# Patient Record
Sex: Male | Born: 1985 | Race: Black or African American | Hispanic: No | Smoking: Current every day smoker
Health system: Southern US, Community
[De-identification: ages and names within clinical notes are randomized; demographics above are authoritative.]

---
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
## 2005-04-11 ENCOUNTER — Emergency Department (HOSPITAL_COMMUNITY): Admission: EM | Admit: 2005-04-11 | Discharge: 2005-04-11 | Payer: Self-pay | Admitting: Emergency Medicine

## 2005-10-15 ENCOUNTER — Emergency Department (HOSPITAL_COMMUNITY): Admission: EM | Admit: 2005-10-15 | Discharge: 2005-10-15 | Payer: Self-pay | Admitting: Emergency Medicine

## 2006-05-17 ENCOUNTER — Emergency Department (HOSPITAL_COMMUNITY): Admission: EM | Admit: 2006-05-17 | Discharge: 2006-05-17 | Payer: Self-pay | Admitting: Emergency Medicine

## 2007-12-17 ENCOUNTER — Emergency Department (HOSPITAL_COMMUNITY): Admission: EM | Admit: 2007-12-17 | Discharge: 2007-12-17 | Payer: Self-pay | Admitting: Emergency Medicine
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
## 2008-09-19 ENCOUNTER — Emergency Department (HOSPITAL_COMMUNITY): Admission: EM | Admit: 2008-09-19 | Discharge: 2008-09-19 | Payer: Self-pay | Admitting: Emergency Medicine

## 2010-04-23 LAB — GC/CHLAMYDIA PROBE AMP, GENITAL
Chlamydia, DNA Probe: NEGATIVE
GC Probe Amp, Genital: NEGATIVE

## 2010-07-31 ENCOUNTER — Emergency Department (HOSPITAL_COMMUNITY)
Admission: EM | Admit: 2010-07-31 | Discharge: 2010-07-31 | Disposition: A | Discharge status: Home / Self Care | Payer: Medicaid Other | Attending: Emergency Medicine | Admitting: Emergency Medicine

## 2010-07-31 DIAGNOSIS — Y92410 Unspecified street and highway as the place of occurrence of the external cause: Secondary | ICD-10-CM | POA: Insufficient documentation

## 2010-07-31 DIAGNOSIS — S335XXA Sprain of ligaments of lumbar spine, initial encounter: Secondary | ICD-10-CM | POA: Insufficient documentation

## 2010-07-31 DIAGNOSIS — R51 Headache: Secondary | ICD-10-CM | POA: Insufficient documentation

## 2010-07-31 DIAGNOSIS — M79609 Pain in unspecified limb: Secondary | ICD-10-CM | POA: Insufficient documentation

## 2010-07-31 DIAGNOSIS — IMO0002 Reserved for concepts with insufficient information to code with codable children: Secondary | ICD-10-CM | POA: Insufficient documentation

## 2010-07-31 DIAGNOSIS — M545 Low back pain, unspecified: Secondary | ICD-10-CM | POA: Insufficient documentation

## 2010-08-02 ENCOUNTER — Emergency Department (HOSPITAL_COMMUNITY)
Admission: EM | Admit: 2010-08-02 | Discharge: 2010-08-02 | Disposition: A | Discharge status: Home / Self Care | Payer: No Typology Code available for payment source | Attending: Emergency Medicine | Admitting: Emergency Medicine

## 2010-08-02 ENCOUNTER — Emergency Department (HOSPITAL_COMMUNITY): Payer: No Typology Code available for payment source

## 2010-08-02 DIAGNOSIS — M549 Dorsalgia, unspecified: Secondary | ICD-10-CM | POA: Insufficient documentation

## 2010-08-02 DIAGNOSIS — S20229A Contusion of unspecified back wall of thorax, initial encounter: Secondary | ICD-10-CM | POA: Insufficient documentation

## 2010-08-02 DIAGNOSIS — IMO0002 Reserved for concepts with insufficient information to code with codable children: Secondary | ICD-10-CM | POA: Insufficient documentation

## 2011-10-04 ENCOUNTER — Emergency Department (HOSPITAL_COMMUNITY): Payer: Medicaid Other

## 2011-10-04 ENCOUNTER — Encounter (HOSPITAL_COMMUNITY): Payer: Self-pay | Admitting: Emergency Medicine

## 2011-10-04 ENCOUNTER — Emergency Department (HOSPITAL_COMMUNITY)
Admission: EM | Admit: 2011-10-04 | Discharge: 2011-10-04 | Disposition: A | Discharge status: Home / Self Care | Payer: Medicaid Other | Attending: Emergency Medicine | Admitting: Emergency Medicine

## 2011-10-04 DIAGNOSIS — T148XXA Other injury of unspecified body region, initial encounter: Secondary | ICD-10-CM | POA: Insufficient documentation

## 2011-10-04 DIAGNOSIS — X58XXXA Exposure to other specified factors, initial encounter: Secondary | ICD-10-CM | POA: Insufficient documentation

## 2011-10-04 LAB — D-DIMER, QUANTITATIVE: D-Dimer, Quant: <0.27 ug/mL-FEU (ref 0.00–0.48)

## 2011-10-04 MED ORDER — OXYCODONE-ACETAMINOPHEN 5-325 MG PO TABS: 1.0000 | ORAL_TABLET | ORAL | Status: DC | PRN

## 2011-10-04 MED ORDER — OXYCODONE-ACETAMINOPHEN 5-325 MG PO TABS
1.0000 | ORAL_TABLET | Freq: Once | ORAL | Status: AC
  Administered 2011-10-04: 1 via ORAL
  Filled 2011-10-04: qty 1

## 2011-10-04 NOTE — Progress Notes (Signed)
Orthopedic Tech Progress Note Patient Details:  Daryl Avery 07-16-1985 [id number]  Ortho Devices Type of Ortho Device: Crutches Ortho Device/Splint Interventions: Ordered;Application   Jennye Moccasin 10/04/2011, 2:11 PM

## 2011-10-04 NOTE — ED Notes (Signed)
Pt here for difficulty walking this am due to pain in left foot.

## 2011-10-04 NOTE — ED Notes (Signed)
Pt c/o left leg pain starting yesterday; pt denies obvious injury

## 2011-10-04 NOTE — ED Provider Notes (Signed)
History  This chart was scribed for Joya Gaskins, MD by Ladona Ridgel Day. This patient was seen in room TR07C/TR07C and the patient's care was started at 1004.   CSN: [id number]  Arrival date & time 10/04/11  1004   First MD Initiated Contact with Patient 10/04/11 1103      Chief Complaint  Patient presents with  . Leg Pain   Patient is a 26 y.o. male presenting with leg pain. The history is provided by the patient. No language interpreter was used.  Leg Pain  The incident occurred 3 to 5 hours ago. There was no injury mechanism. The pain is present in the left thigh. The pain is mild. The pain has been constant since onset. Numbness: None now but earlier this AM had numbness. He reports no foreign bodies present. The symptoms are aggravated by bearing weight. He has tried nothing for the symptoms.   Daryl Avery is a 26 y.o. male who presents to the Emergency Department complaining of constant non radiating left thigh pain since this AM with no injury to his leg/thigh. He denies any falls, trips, or twisting his leg. He states that he does some lifting at his job. He denies any recent travels or surgeries. He denies any similar previous episodes. He denies CP or SOB. He has no medical history.  PMH -none  History reviewed. No pertinent past surgical history.  History reviewed. No pertinent family history.  History  Substance Use Topics  . Smoking status: Never Smoker   . Smokeless tobacco: Not on file  . Alcohol Use: No      Review of Systems  Constitutional: Negative for fever and activity change.  Respiratory: Negative for shortness of breath.   Cardiovascular: Negative for chest pain.  Gastrointestinal: Negative for abdominal distention.  Musculoskeletal: Negative for back pain.       Left thigh pain  Neurological: Numbness: None now but earlier this AM had numbness.    Allergies  Review of patient's allergies indicates no known allergies.  Home Medications  No  current outpatient prescriptions on file.  Triage Vitals: BP 122/70  Pulse 69  Temp 98.4 F (36.9 C) (Oral)  Resp 18  SpO2 99%  Physical Exam CONSTITUTIONAL: Well developed/well nourished HEAD AND FACE: Normocephalic/atraumatic EYES: EOMI/PERRL ENMT: Mucous membranes moist NECK: supple no meningeal signs SPINE:entire spine nontender CV: S1/S2 noted, no murmurs/rubs/gallops noted LUNGS: Lungs are clear to auscultation bilaterally, no apparent distress ABDOMEN: soft, nontender, no rebound or guarding,  GU:no cva tenderness, no testicular tenderness, no hernia (family present at his request) NEURO: Pt is awake/alert, moves all extremitiesx4 EXTREMITIES: pulses normal, full ROM, tenderness along medial aspect of left thigh, no erythema or edema, full ROM of hip and knee.  No calf tenderness.  No bony tenderness to left knee/ankle/foot.  Minimal tenderness with rom of left hip (he will allow me to fully range hip) no warmth noted to left hip SKIN: warm, color normal PSYCH: no abnormalities of mood noted  ED Course  Procedures DIAGNOSTIC STUDIES: Oxygen Saturation is 99% on room air, normal by my interpretation.    COORDINATION OF CARE: At 1135 AM Discussed treatment plan with patient which includes left thigh X-ray. Patient agrees.   Low risk for DVT And ddimer neg Xray negative He has tenderness mostly along medial aspect of thigh.  He reports he may have no injured at work.  Doubt occult fx or septic joint.  Advised crutches and outpatient followup  MDM  Nursing notes including past medical history and social history reviewed and considered in documentation xrays reviewed and considered Labs/vital reviewed and considered    I personally performed the services described in this documentation, which was scribed in my presence. The recorded information has been reviewed and considered.          Joya Gaskins, MD 10/04/11 [phone]

## 2013-07-30 ENCOUNTER — Encounter (HOSPITAL_COMMUNITY): Payer: Self-pay | Admitting: Emergency Medicine

## 2013-07-30 ENCOUNTER — Emergency Department (HOSPITAL_COMMUNITY)
Admission: EM | Admit: 2013-07-30 | Discharge: 2013-07-30 | Disposition: A | Discharge status: Home / Self Care | Payer: Medicaid Other | Attending: Emergency Medicine | Admitting: Emergency Medicine

## 2013-07-30 DIAGNOSIS — L03119 Cellulitis of unspecified part of limb: Principal | ICD-10-CM

## 2013-07-30 DIAGNOSIS — L0291 Cutaneous abscess, unspecified: Secondary | ICD-10-CM

## 2013-07-30 DIAGNOSIS — L02419 Cutaneous abscess of limb, unspecified: Secondary | ICD-10-CM | POA: Insufficient documentation

## 2013-07-30 MED ORDER — CEPHALEXIN 500 MG PO CAPS: ORAL_CAPSULE | ORAL | Status: DC

## 2013-07-30 MED ORDER — OXYCODONE-ACETAMINOPHEN 5-325 MG PO TABS: 1.0000 | ORAL_TABLET | Freq: Four times a day (QID) | ORAL | Status: DC | PRN

## 2013-07-30 MED ORDER — SULFAMETHOXAZOLE-TMP DS 800-160 MG PO TABS: 1.0000 | ORAL_TABLET | Freq: Two times a day (BID) | ORAL | Status: DC

## 2013-07-30 NOTE — ED Provider Notes (Signed)
CSN: [id number]     Arrival date & time 07/30/13  1707 History   None    This chart was scribed for non-physician practitioner, Allen Derry PA-C  working with Lyanne Co, MD by Arlan Organ, ED Scribe. This patient was seen in room TR10C/TR10C and the patient's care was started at 6:36 PM.   Chief Complaint  Patient presents with  . Abscess   Patient is a 28 y.o. male presenting with abscess. The history is provided by the patient. No language interpreter was used.  Abscess Location:  Leg Leg abscess location:  L knee Size:  6 cm Abscess quality: fluctuance, induration and painful   Abscess quality: not draining   Duration:  3 days Progression:  Worsening Pain details:    Duration:  3 days   Timing:  Constant   Progression:  Unchanged Chronicity:  New Context: not insect bite/sting   Relieved by:  None tried Worsened by:  Nothing tried Ineffective treatments:  None tried Associated symptoms: no fever     HPI Comments: Daryl Avery is a 28 y.o. male who presents to the Emergency Department complaining of a constant, moderately painful abscess to the posterior L knee x 3 days that has progressively worsened, non-radiating pain. Denies red streaking or drainage. Discomfort is exacerbated with flexion/extension of the L knee, palpation, and ambulation. No alleviating factors at this time. He has not tried anything OTC or any home remedies for improvement of symptoms. At this time he denies any fever, chills, SOB, chest pain, N/V, or abdominal pain. No swelling to lower extremities, no recent traveling. Pt is unaware of any recent insect bites or trauma to this area. He denies any known allergies to medications. Pt is otherwise healthy without any medical problems, denies hx of DM. No other concerns this visit.  History reviewed. No pertinent past medical history. History reviewed. No pertinent past surgical history. No family history on file. History  Substance Use  Topics  . Smoking status: Never Smoker   . Smokeless tobacco: Not on file  . Alcohol Use: No    Review of Systems  Constitutional: Negative for fever and chills.  Respiratory: Negative for shortness of breath.   Cardiovascular: Negative for chest pain.  Skin: Positive for wound (Abscess to posterior L knee).  Psychiatric/Behavioral: Negative for confusion.      Allergies  Review of patient's allergies indicates no known allergies.  Home Medications   Prior to Admission medications   Medication Sig Start Date End Date Taking? Authorizing Provider  cephALEXin (KEFLEX) 500 MG capsule 2 caps po bid x 7 days 07/30/13   Jimmy Plessinger Strupp Camprubi-Soms, PA-C  oxyCODONE-acetaminophen (PERCOCET) 5-325 MG per tablet Take 1-2 tablets by mouth every 6 (six) hours as needed for severe pain. 07/30/13   Benancio Osmundson Strupp Camprubi-Soms, PA-C  sulfamethoxazole-trimethoprim (BACTRIM DS) 800-160 MG per tablet Take 1 tablet by mouth 2 (two) times daily. 07/30/13   Joelys Staubs Strupp Camprubi-Soms, PA-C   Triage Vitals: BP 128/84  Pulse 83  Temp(Src) 98 F (36.7 C) (Oral)  Resp 16  Wt 153 lb 4.8 oz (69.536 kg)  SpO2 97%   Physical Exam  Nursing note and vitals reviewed. Constitutional: He is oriented to person, place, and time. Vital signs are normal. He appears well-developed and well-nourished.  Non-toxic appearance. No distress.  Afebrile, nontoxic, NAD  HENT:  Head: Normocephalic.  Mouth/Throat: Mucous membranes are normal.  Eyes: Conjunctivae and EOM are normal.  Neck: Normal range of motion.  Cardiovascular:  Normal rate, intact distal pulses and normal pulses.   Intact distal pulses  Pulmonary/Chest: Effort normal.  Abdominal: Normal appearance. He exhibits no distension.  Musculoskeletal:       Right knee: Normal. He exhibits normal range of motion, no swelling, no effusion, no erythema, normal alignment, no LCL laxity, no bony tenderness, normal meniscus and no MCL laxity. No tenderness  found.       Left knee: He exhibits decreased range of motion (secondary to pain) and swelling (induration posteriorly). He exhibits no effusion, no laceration, no erythema, normal alignment, no LCL laxity, no bony tenderness, normal meniscus and no MCL laxity. No tenderness found.       Legs: L posterior knee with area of induration approx 8cm in diameter, with small 3cm area of fluctuance in center, TTP with no warmth, erythema or red streaking, ROM limited due to pain but passive ROM fully intact. No bony or joint line TTP, no laxity or joint effusion. Neg homan's. No pedal edema  Neurological: He is alert and oriented to person, place, and time. He has normal strength. No sensory deficit.  Strength 5/5 in all extremities, neurovascularly intact in BLEs.  Skin: Skin is warm and dry.     8cm area of induration located in posterior L knee, small 3cm area of fluctuance noted, mildly TTP, no warmth or surrounding cellulitis.   Psychiatric: He has a normal mood and affect.    ED Course  INCISION AND DRAINAGE Date/Time: 07/31/2013 6:45 PM Performed by: CAMPRUBI-SOMS, Tyrese Capriotti STRUPP Authorized by: Ramond MarrowAMPRUBI-SOMS, Baudelia Schroepfer STRUPP Consent: Verbal consent obtained. Risks and benefits: risks, benefits and alternatives were discussed Consent given by: patient Patient understanding: patient states understanding of the procedure being performed Patient consent: the patient's understanding of the procedure matches consent given Patient identity confirmed: verbally with patient Type: abscess Body area: lower extremity Location details: left leg Anesthesia: local infiltration Local anesthetic: lidocaine 2% without epinephrine Anesthetic total: 1 ml Patient sedated: no Scalpel size: 11 Needle gauge: 22 Incision type: single straight Complexity: simple Drainage: purulent and  bloody Drainage amount: moderate Packing material: 1/4 in iodoform gauze Patient tolerance: Patient tolerated the  procedure well with no immediate complications. Comments: Site anesthetized, incision made over site, wound drained and explored loculations, rinsed with copious amounts of normal saline, wound packed with sterile gauze, covered with dry, sterile dressing.  Pt tolerated procedure well without complications.  Instructions for care discussed verbally and pt provided with additional written instructions for homecare and f/u.     (including critical care time)  DIAGNOSTIC STUDIES: Oxygen Saturation is 97% on RA, Adequate by my interpretation.    COORDINATION OF CARE: 6:44 PM- Will perform I&D and give 2 antibiotics to help manage symptoms. Advised pt to follow up at Urgent Care. Encouraged pt to watch for signs of infection outlined on discharge papers. Discussed treatment plan with pt at bedside and pt agreed to plan.     Labs Review Labs Reviewed  WOUND CULTURE    Imaging Review No results found.   EKG Interpretation None      MDM   Final diagnoses:  Abscess    Daryl Avery is a 28 y.o. male with no significant PMHx presenting with 3 days of painful abscess to L posterior knee. Afebrile, non-toxic. Small area of fluctuance located in center of induration. No surrounding cellulitis, no s/sx of DVT, no LE edema. No hx of MRSA or DM2. I&D with 1/4" packing tolerated well by pt. Purulent material was  cultured. Given abx and percocet, instructed to f/up with Urgent care in 2 days for packing removal and evaluation. Discussed s/sx of infection to monitor for, as well as d/c instructions on care of abscess and packing. No need for imaging at this time given that this was a simple uncomplicated abscess which was superficial and did not have s/sx of deep space infection. I explained the diagnosis and have given explicit precautions to return to the ER including for any other new or worsening symptoms. The patient understands and accepts the medical plan as it's been dictated and I have  answered their questions. Discharge instructions concerning home care and prescriptions have been given. The patient is STABLE and is discharged to home in good condition.   I personally performed the services described in this documentation, which was scribed in my presence. The recorded information has been reviewed and is accurate.  BP 128/84  Pulse 83  Temp(Src) 98 F (36.7 C) (Oral)  Resp 16  Wt 153 lb 4.8 oz (69.536 kg)  SpO2 97%     [employer], PA-C 07/31/13 [phone]

## 2013-07-30 NOTE — Discharge Instructions (Signed)
Keep wound clean and dry. Apply warm compresses to affected area throughout the day. Take antibiotic until it is finished. Take percocet as directed, as needed for pain but do not drive or operate machinery with pain medication use. [facility] 2 days for wound recheck and packing removal.  Return to emergency department for emergent changing or worsening symptoms.   Abscess An abscess (boil or furuncle) is an infected area on or under the skin. This area is filled with yellowish-white fluid (pus) and other material (debris). HOME CARE   Only take medicines as told by your doctor.  If you were given antibiotic medicine, take it as directed. Finish the medicine even if you start to feel better.  If gauze is used, follow your doctor's directions for changing the gauze.  To avoid spreading the infection:  Keep your abscess covered with a bandage.  Wash your hands well.  Do not share personal care items, towels, or whirlpools with others.  Avoid skin contact with others.  Keep your skin and clothes clean around the abscess.  Keep all doctor visits as told. GET HELP RIGHT AWAY IF:   You have more pain, puffiness (swelling), or redness in the wound site.  You have more fluid or blood coming from the wound site.  You have muscle aches, chills, or you feel sick.  You have a fever. MAKE SURE YOU:   Understand these instructions.  Will watch your condition.  Will get help right away if you are not doing well or get worse. Document Released: 06/22/2007 Document Revised: 07/05/2011 Document Reviewed: 03/18/2011 [facility] Patient Information 2015 [place]. This information is not intended to replace advice given to you by your health care provider. Make sure you discuss any questions you have with your health care provider.  Abscess Care After An abscess (also called a boil or furuncle) is an infected area that contains a  collection of pus. Signs and symptoms of an abscess include pain, tenderness, redness, or hardness, or you may feel a moveable soft area under your skin. An abscess can occur anywhere in the body. The infection may spread to surrounding tissues causing cellulitis. A cut (incision) by the surgeon was made over your abscess and the pus was drained out. Gauze may have been packed into the space to provide a drain that will allow the cavity to heal from the inside outwards. The boil may be painful for 5 to 7 days. Most people with a boil do not have high fevers. Your abscess, if seen early, may not have localized, and may not have been lanced. If not, another appointment may be required for this if it does not get better on its own or with medications. HOME CARE INSTRUCTIONS   Only take over-the-counter or prescription medicines for pain, discomfort, or fever as directed by your caregiver.  When you bathe, soak and then remove gauze or iodoform packs at least daily or as directed by your caregiver. You may then wash the wound gently with mild soapy water. Repack with gauze or do as your caregiver directs. SEEK IMMEDIATE MEDICAL CARE IF:   You develop increased pain, swelling, redness, drainage, or bleeding in the wound site.  You develop signs of generalized infection including muscle aches, chills, fever, or a general ill feeling.  An oral temperature above 102 F (38.9 C) develops, not controlled by medication. See your caregiver for a recheck if you develop any of the symptoms described  above. If medications (antibiotics) were prescribed, take them as directed. Document Released: 07/22/2004 Document Revised: 03/28/2011 Document Reviewed: 03/19/2007 [facility] Patient Information 2015 [place], [place]. This information is not intended to replace advice given to you by your health care provider. Make sure you discuss any questions you have with your health care provider.  Emergency Department Resource  Guide 1) Find a Doctor and Pay Out of Pocket Although you won't have to find out who is covered by your insurance plan, it is a good idea to ask around and get recommendations. You will then need to call the office and see if the doctor you have chosen will accept you as a new patient and what types of options they offer for patients who are self-pay. Some doctors offer discounts or will set up payment plans for their patients who do not have insurance, but you will need to ask so you aren't surprised when you get to your appointment.  2) Contact Your Local Health Department Not all health departments have doctors that can see patients for sick visits, but many do, so it is worth a call to see if yours does. If you don't know where your local health department is, you can check in your phone book. The CDC also has a tool to help you locate your state's health department, and many state websites also have listings of all of their local health departments.  3) Find a Walk-in Clinic If your illness is not likely to be very severe or complicated, you may want to try a walk in clinic. These are popping up all over the country in pharmacies, drugstores, and shopping centers. They're usually staffed by nurse practitioners or physician assistants that have been trained to treat common illnesses and complaints. They're usually fairly quick and inexpensive. However, if you have serious medical issues or chronic medical problems, these are probably not your best option.  No Primary Care Doctor: - Call Health Connect at  [phone] - they can help you locate a primary care doctor that  accepts your insurance, provides certain services, etc. - Physician Referral Service- [phone]  Chronic Pain Problems: Organization         Address  Phone   Notes  [facility]  [phone] Patients need to be referred by their primary care doctor.   Medication Assistance: Organization          Address  Phone   Notes  [facility] Medication Physicians [facility] [address] [place], [place] [address] [phone] --Must be a resident of [facility] -- Must have NO insurance coverage whatsoever (no Medicaid/ Medicare, etc.) -- The pt. MUST have a primary care doctor that directs their care regularly and follows them in the community   [facility]  [phone]   [employer]  [phone]    Agencies that provide inexpensive medical care: Organization         Address  Phone   Notes  [facility]  [phone]   [facility]    [phone]   [facility] [address] [place], [place] [address] [phone]   [facility] [address] [place] [place] [phone]   Planned Parenthood    [phone]   [facility]    [phone]   [facility]  [address],  Phone:  [phone], Fax:  [phone] Hours  of Operation:  9 am - 6 pm, M-F.  Also accepts Medicaid/Medicare and self-pay.  [facility] [place] [facility]  [address], [place] Phone: [phone], Fax: [phone]. Hours of Operation:  8:30 am - 5:30 pm, M-F.  Also accepts Medicaid and self-pay.  [facility] [place] [address] [place] Phone: [phone]   [facility] [address] Natasha Bence [place], [place] [phone], Ext. [phone] Mondays & Thursdays: 7-9 AM.  First 15 patients are seen on a first come, first serve basis.    Medicaid-accepting [facility] Providers:  Organization         Address  Phone   Notes  [facility] [address], [place] [phone] Also accepts self-pay patients.  [facility] [address]ure[address] Josephs [place], [place]  [phone]   [facility] [address], [place] [phone]   [facility] [address] [place] [phone]   Renaye Rakers [address], [place]   [phone] Only accepts [place] Access [place] patients after they have their name applied to their card.   Self-Pay (no insurance) in [facility]:  Organization         Address  Phone   Notes  Sickle Cell Patients, [facility] Internal Medicine [address] [place], [place] [phone]   [facility] [address] [place], [place] [phone]   [facility] [place]  [address], [address],  [phone]   [facility]/Dr. Osei-Bonsu  [address] [place] or [address], [place] [phone] Phone number for both [place] and [place] locations is the same.  Urgent Medical and [facility] [address], [place] [phone]   [facility] [address] [place] or [address] [phone]   [facility] [address] [place] [phone], phone; [phone], fax Sees patients 1st and 3rd Saturday of every month.  Must not qualify for public or private insurance (i.e. Medicaid, Medicare, [place] Health Choice, Veterans' Benefits)  Household income should be no more than 200% of the poverty level The clinic cannot treat you if you are pregnant or think you are pregnant  Sexually transmitted diseases are not treated at the clinic.    Dental Care: Organization         Address  Phone  Notes  [facility] Department of [facility] [address] [place], [place] [phone] Accepts children up to age 62 who are enrolled in [place] or [place] Health Choice; pregnant women with a Medicaid card; and children who have applied for Medicaid or [place] Health Choice, but were declined, whose parents can pay a reduced fee at time of service.  [facility] Department of [facility]  [address], [place] [phone] Accepts children up to age 30 who are enrolled in [place] or [place] Health Choice; pregnant women with a Medicaid card; and children who have applied for Medicaid or  Health Choice, but were declined, whose parents can pay a reduced fee at time of service.  [facility]  [address] [place], [place] [phone] Patients are seen by appointment only. Walk-ins are not accepted. [facility] will see  patients 63 years of age and older. Monday - Tuesday (8am-5pm) Most Wednesdays (8:30-5pm) $30 per visit, cash only  [facility] Access PRO[facility]  [address], [facility] [place] [phone] Patients are seen by appointment only. Walk-ins are not accepted. [facility] will see patients 44 years of age and older. One Wednesday Evening (Monthly: Volunteer Based).  $30 per visit, cash only  [employer]  [phone] for adults; Children under age 42, call Graduate Pediatric Dentistry at [phone]. Children aged 62-14, please call [phone] to request a pediatric application.  Dental services are provided in all areas of dental care including fillings, crowns and bridges, complete and partial dentures, implants, gum treatment, root canals, and extractions. Preventive care is also provided. Treatment is provided to both adults and children. Patients are selected via a lottery and there is often a waiting list.   [facility] [address] [place]  [phone] [email or web address]   [facility] [address] [place], [place] [phone], Ext. [phone] Second and Fourth Thursday of each month, opens at 6:30 AM; Clinic ends at 9 AM.  Patients are seen on a first-come first-served basis, and a limited number are seen during each clinic.   [facility]  [address] Ether Griffins [place], [place] [phone]   Eligibility Requirements You must  have lived in [place], [place], or [place] counties for at least the last three months.   You cannot be eligible for state or federal sponsored [place], including [employer], [place], or [employer].   You generally cannot be eligible for healthcare insurance through your employer.    How to apply: Eligibility screenings are held every Tuesday and Wednesday afternoon from 1:00 pm until 4:00 pm. You do not need an appointment for the interview!  [facility] [address] [place], [place] [phone]   [facility] Health Department  [phone]   [facility] Health Department  [phone]   [facility] Health Department  [phone]    Behavioral Health Resources in the Community: Intensive Outpatient Programs Organization         Address  Phone  Notes  [facility] [address] [place], [place] [phone]   [facility] Outpatient [address] [place], [place] [phone]   [facility] [address], [place], [place]  [phone]   [facility] Mental Health [address],  [place], [place] [phone] or [phone]   Substance Abuse Resources Organization         Address  Phone  Notes  [facility] Drug Services  [phone]   [facility]  [phone]   [facility]  [phone]   Floydene Flock  [phone]   Residential & Outpatient Substance Abuse Program  [phone]   Psychological Services Organization         Address  Phone  Notes  [facility] Behavioral Health  [phone]   [facility]  [phone]   [facility] Mental Health [address] [place] [phone] or [phone]    Mobile Crisis Teams Organization         Address  Phone  Notes  [facility], Mobile Crisis Care Unit  [phone]   [facility]  [address] [place], [place] [phone]   [facility] [address] [place] [phone]    Self-Help/Support Groups Organization  Address  Phone             Notes  [facility] [place] - variety of support groups  336- I7437963 Call for more information  Narcotics Anonymous (NA), [facility] [address], [employer] [place]  2 meetings at this location   Statistician         Address  Phone  Notes  [facility] [address],    [place]  [phone]   [facility]  [address] [place] [address], [place], [place] [phone]   [facility] [address] [place], [place] [phone] Admissions: 8am-3pm M-F  [facility] [address]    [place], [place] [phone]   [facility] [address] [place], [place], [place] [phone]   [facility] [address]  [place], [place] [phone]   [facility] - Intensive Outpatient [address], [place], [place] [phone]   [facility] (Addiction Recovery [facility] [address]  [place], [place] [phone] or 7043828632   [facility] ([facility]) [address] [place], [place] [phone] Accepts Medicaid  [facility] [address]  [place] [phone] Substance Abuse/Addiction Treatment   [facility] Organization         Address  Phone  Notes  [facility]  [phone]   Angie Fava, PhD [address] Ervin Knack [place], [place]   [phone] or [phone]   [facility]   [address] [place], [place] [phone]   [facility] [address] [place], [place] [phone] Insurance/Medicaid/sponsorship through [facility] and Families [address]                                    [place], [place] [phone] Therapy/tele-psych/case  [facility] [address], [place] [phone]    Dr. Lolly Mustache  [phone]   [facility]  [employer] [facility] Dept. 1) [address] [place] 2) [address], [place] 3)  [address], [place] [phone]  [phone]   [facility] Child Abuse Hotline (435)593-8630 or [phone] (After Hours)

## 2013-07-30 NOTE — ED Notes (Signed)
Abscess behind lt. Knee.  Red and swollen with a white center, no drainage.  Painful to touch and when pt. Bends his leg.

## 2013-08-01 ENCOUNTER — Emergency Department (HOSPITAL_COMMUNITY)
Admission: EM | Admit: 2013-08-01 | Discharge: 2013-08-01 | Disposition: A | Discharge status: Home / Self Care | Payer: Medicaid Other | Source: Home / Self Care

## 2013-08-01 ENCOUNTER — Encounter (HOSPITAL_COMMUNITY): Payer: Self-pay | Admitting: Emergency Medicine

## 2013-08-01 ENCOUNTER — Emergency Department (HOSPITAL_COMMUNITY)
Admission: EM | Admit: 2013-08-01 | Discharge: 2013-08-01 | Disposition: A | Discharge status: Home / Self Care | Payer: Medicaid Other | Attending: Emergency Medicine | Admitting: Emergency Medicine

## 2013-08-01 DIAGNOSIS — Z09 Encounter for follow-up examination after completed treatment for conditions other than malignant neoplasm: Secondary | ICD-10-CM

## 2013-08-01 DIAGNOSIS — Z4801 Encounter for change or removal of surgical wound dressing: Secondary | ICD-10-CM | POA: Insufficient documentation

## 2013-08-01 DIAGNOSIS — Z792 Long term (current) use of antibiotics: Secondary | ICD-10-CM | POA: Diagnosis not present

## 2013-08-01 MED ORDER — OXYCODONE-ACETAMINOPHEN 5-325 MG PO TABS: 1.0000 | ORAL_TABLET | Freq: Four times a day (QID) | ORAL | Status: DC | PRN

## 2013-08-01 NOTE — ED Provider Notes (Signed)
Medical screening examination/treatment/procedure(s) were performed by non-physician practitioner and as supervising physician I was immediately available for consultation/collaboration.   EKG Interpretation None        Grantley Savage, MD 08/01/13 1341 

## 2013-08-01 NOTE — ED Notes (Signed)
Pt here for wound check from cyst that was drained two days ago per pt; pt sts taking antibiotics and sts some drainage

## 2013-08-01 NOTE — Discharge Instructions (Signed)
Continue to take your antibiotics until complete, even if your wound seems completely healed.  Be sure to follow up with urgent care or a primary care provider in 3-4 days for wound recheck. See below for further instructions.

## 2013-08-01 NOTE — ED Provider Notes (Signed)
CSN: [id number]     Arrival date & time 08/01/13  1008 History  This chart was scribed for non-physician practitioner Junius Finner, working with Glynn Octave, MD by Carl Best, ED Scribe. This patient was seen in room TR05C/TR05C and the patient's care was started at 10:48 AM.    Chief Complaint  Patient presents with  . Wound Check    Patient is a 28 y.o. male presenting with wound check. The history is provided by the patient. No language interpreter was used.  Wound Check   HPI Comments: Daryl Avery is a 28 y.o. male who presents to the Emergency Department for a wound check of an abscess located on his left leg that was drained two days ago.  He denies experiencing any pain.  The patient states that he is still taking the Bactrim and Keflex he was was discharged with at his last visit.  He states that there is still some drainage noted from the I&D site.  He denies fever, nausea, and vomiting as associated symptoms and states that he has more movement in his left leg.  He denies applying warm compresses to the area.  He has no prior history of abscess.  He denies having a PCP.   History reviewed. No pertinent past medical history. History reviewed. No pertinent past surgical history. History reviewed. No pertinent family history. History  Substance Use Topics  . Smoking status: Never Smoker   . Smokeless tobacco: Not on file  . Alcohol Use: No    Review of Systems  Constitutional: Negative for fever.  Gastrointestinal: Negative for nausea and vomiting.  Skin: Positive for wound.  All other systems reviewed and are negative.     Allergies  Review of patient's allergies indicates no known allergies.  Home Medications   Prior to Admission medications   Medication Sig Start Date End Date Taking? Authorizing Provider  cephALEXin (KEFLEX) 500 MG capsule 2 caps po bid x 7 days 07/30/13   Mercedes Strupp Camprubi-Soms, PA-C  oxyCODONE-acetaminophen (PERCOCET) 5-325  MG per tablet Take 1-2 tablets by mouth every 6 (six) hours as needed for severe pain. 07/30/13   Mercedes Strupp Camprubi-Soms, PA-C  sulfamethoxazole-trimethoprim (BACTRIM DS) 800-160 MG per tablet Take 1 tablet by mouth 2 (two) times daily. 07/30/13   Mercedes Strupp Camprubi-Soms, PA-C   Triage Vitals: BP 130/81  Pulse 71  Temp(Src) 98.1 F (36.7 C) (Oral)  Resp 18  SpO2 97%  Physical Exam  Nursing note and vitals reviewed. Constitutional: He is oriented to person, place, and time. He appears well-developed and well-nourished.  HENT:  Head: Normocephalic and atraumatic.  Eyes: EOM are normal.  Neck: Normal range of motion.  Cardiovascular: Normal rate.   Pulmonary/Chest: Effort normal.  Musculoskeletal: Normal range of motion.  Full range of motion of left knee.  Neurological: He is alert and oriented to person, place, and time.  Skin: Skin is warm and dry.  Left posterior leg just inferior to knee 2-3 cm area of induration and tenderness with centralized opening wound packing in place.  Scant red blood present.  No pus.  Mild tenderness to palpation.    Psychiatric: He has a normal mood and affect. His behavior is normal.    ED Course  Procedures  The wound is cleansed, debrided of foreign material as much as possible, and dressed. The patient is alerted to watch for any signs of infection (redness, pus, pain, increased swelling or fever) and call if such occurs. Home wound care  instructions are provided.   DIAGNOSTIC STUDIES: Oxygen Saturation is 97% on room air, adequate by my interpretation.    COORDINATION OF CARE: 10:50 AM- Advised the patient to massage the area to assist the wound in draining and to finish his course of antibiotics.  Discussed discharging the patient with a referral to an outpatient clinic for follow-up in the next 2-3 days.  The patient agreed to the treatment plan.   Labs Review Labs Reviewed - No data to display  Imaging Review No results  found.   EKG Interpretation None      MDM   Final diagnoses:  Encounter for recheck of abscess following incision and drainage    Pt is a 28yo male presenting to ED for recheck of abscess that was I&D on 7/14.  Abscess appears to be healing well.  Packing removed. Bandage placed. Home care instructions provided. Advised to f/u with PCP or urgent care for wound recheck in 3-4 days. Return precautions provided. Pt verbalized understanding and agreement with tx plan.   I personally performed the services described in this documentation, which was scribed in my presence. The recorded information has been reviewed and is accurate.   Junius Finnerrin O'Malley, PA-C 08/01/13 1113

## 2013-08-02 ENCOUNTER — Telehealth (HOSPITAL_BASED_OUTPATIENT_CLINIC_OR_DEPARTMENT_OTHER): Payer: Self-pay | Admitting: Emergency Medicine

## 2013-08-02 LAB — WOUND CULTURE: Special Requests: NORMAL

## 2013-08-02 NOTE — Telephone Encounter (Signed)
Post ED Visit - Positive Culture Follow-up   Positive wound culture +MRSA Treated with Bactrim DS and Keflex, organism sensitive to the same and no further patient follow-up is required at this time.  08/02/13 @ 1338, left message for patient to call flow managers #  Jiles HaroldGammons, Tienna Bienkowski Chaney 08/02/2013, 1:39 PM

## 2013-08-03 ENCOUNTER — Telehealth (HOSPITAL_COMMUNITY): Payer: Self-pay

## 2013-08-03 NOTE — ED Provider Notes (Signed)
Medical screening examination/treatment/procedure(s) were performed by non-physician practitioner and as supervising physician I was immediately available for consultation/collaboration.   EKG Interpretation None        Daryl CoKevin M Zelma Mazariego, MD 08/03/13 [phone]

## 2013-08-04 ENCOUNTER — Telehealth (HOSPITAL_BASED_OUTPATIENT_CLINIC_OR_DEPARTMENT_OTHER): Payer: Self-pay

## 2013-08-04 ENCOUNTER — Telehealth (HOSPITAL_BASED_OUTPATIENT_CLINIC_OR_DEPARTMENT_OTHER): Payer: Self-pay | Admitting: Emergency Medicine

## 2013-08-04 NOTE — Telephone Encounter (Addendum)
Late entry 08/04/2013 @ 2153 Pt returned call.  Pt informed of dx, tx prescribed appropriate and MRSA precautions reviewed.

## 2013-10-25 ENCOUNTER — Emergency Department (HOSPITAL_COMMUNITY)
Admission: EM | Admit: 2013-10-25 | Discharge: 2013-10-25 | Disposition: A | Discharge status: Home / Self Care | Payer: Medicaid Other | Attending: Emergency Medicine | Admitting: Emergency Medicine

## 2013-10-25 ENCOUNTER — Encounter (HOSPITAL_COMMUNITY): Payer: Self-pay | Admitting: Emergency Medicine

## 2013-10-25 DIAGNOSIS — L03313 Cellulitis of chest wall: Secondary | ICD-10-CM | POA: Insufficient documentation

## 2013-10-25 DIAGNOSIS — L0291 Cutaneous abscess, unspecified: Secondary | ICD-10-CM

## 2013-10-25 DIAGNOSIS — L0390 Cellulitis, unspecified: Secondary | ICD-10-CM

## 2013-10-25 MED ORDER — LIDOCAINE-EPINEPHRINE (PF) 2 %-1:200000 IJ SOLN
10.0000 mL | Freq: Once | INTRAMUSCULAR | Status: AC
  Administered 2013-10-25: 10 mL
  Filled 2013-10-25: qty 20

## 2013-10-25 MED ORDER — CEPHALEXIN 500 MG PO CAPS: 500.0000 mg | ORAL_CAPSULE | Freq: Four times a day (QID) | ORAL | Status: DC

## 2013-10-25 MED ORDER — SULFAMETHOXAZOLE-TRIMETHOPRIM 800-160 MG PO TABS: 1.0000 | ORAL_TABLET | Freq: Two times a day (BID) | ORAL | Status: DC

## 2013-10-25 NOTE — ED Notes (Signed)
Pt reports having abscess under right arm x 2 days. Denies fevers. No acute distress noted at triage.

## 2013-10-25 NOTE — ED Provider Notes (Signed)
Medical screening examination/treatment/procedure(s) were performed by non-physician practitioner and as supervising physician I was immediately available for consultation/collaboration.   EKG Interpretation None      Devoria AlbeIva Jered Heiny, MD, Armando GangFACEP   Ward GivensIva L Milt Coye, MD 10/25/13 [phone]

## 2013-10-25 NOTE — ED Provider Notes (Signed)
CSN: [id number]     Arrival date & time 10/25/13  1044 History  This chart was scribed for non-physician practitioner, Santiago GladHeather Magalie Almon, PA-C, working with Ward GivensIva L Knapp, MD by Charline BillsEssence Howell, ED Scribe. This patient was seen in room TR11C/TR11C and the patient's care was started at 11:54 AM.   Chief Complaint  Patient presents with  . Abscess   The history is provided by the patient. No language interpreter was used.   HPI Comments: Daryl Avery is a 28 y.o. male who presents to the Emergency Department complaining of gradually improving abscess to R lateral upper chest first noted 2 days ago. Pt reports h/o abscess to leg. He denies fever, chills, nausea, vomiting, drainage from the site. He also denies h/o DM or HIV. Pt has not tried to drain the abscess.  History reviewed. No pertinent past medical history. History reviewed. No pertinent past surgical history. History reviewed. No pertinent family history. History  Substance Use Topics  . Smoking status: Never Smoker   . Smokeless tobacco: Not on file  . Alcohol Use: No    Review of Systems  Constitutional: Negative for fever and chills.  Gastrointestinal: Negative for nausea and vomiting.  Skin:       + Abscess  All other systems reviewed and are negative.  Allergies  Review of patient's allergies indicates no known allergies.  Home Medications   Prior to Admission medications   Not on File   Triage Vitals: BP 115/81  Pulse 60  Temp(Src) 97.9 F (36.6 C) (Oral)  Resp 14  Ht 5' (1.524 m)  SpO2 98% Physical Exam  Nursing note and vitals reviewed. Constitutional: He is oriented to person, place, and time. He appears well-developed and well-nourished. No distress.  HENT:  Head: Normocephalic and atraumatic.  Eyes: Conjunctivae and EOM are normal.  Neck: Neck supple. No tracheal deviation present.  Cardiovascular: Normal rate.   Pulmonary/Chest: Effort normal. No respiratory distress.  Musculoskeletal: Normal  range of motion.  Neurological: He is alert and oriented to person, place, and time.  Skin: Skin is warm and dry.  R lateral upper chest: 2 cm abscess with surrounding erythema and warmth.  No erythematous streaking No active drainage   Psychiatric: He has a normal mood and affect. His behavior is normal.   ED Course  Procedures (including critical care time) DIAGNOSTIC STUDIES: Oxygen Saturation is 98% on RA, normal by my interpretation.    COORDINATION OF CARE: 11:57 AM-Discussed treatment plan which includes I&D with pt at bedside and pt agreed to plan.   Labs Review Labs Reviewed - No data to display  Imaging Review No results found.   EKG Interpretation None     INCISION AND DRAINAGE Performed by: Santiago GladLaisure, Nachelle Negrette Consent: Verbal consent obtained. Risks and benefits: risks, benefits and alternatives were discussed Type: abscess  Body area: right lateral chest  Anesthesia: local infiltration  Incision was made with a scalpel.  Local anesthetic: lidocaine 2% with epinephrine  Anesthetic total: 3 ml  Complexity: complex Blunt dissection to break up loculations  Drainage: purulent  Drainage amount: small   Patient tolerance: Patient tolerated the procedure well with no immediate complications.    MDM   Final diagnoses:  None   Patient with skin abscess amenable to incision and drainage.  Abscess was not large enough to warrant packing or drain,  wound recheck in 2 days. Encouraged warm compresses.  Mild signs of cellulitis is surrounding skin.  Will d/c to home.  Patient  discharged home with prescription for Bactrim DS and Keflex.  Return precautions given.   I personally performed the services described in this documentation, which was scribed in my presence. The recorded information has been reviewed and is accurate.    Santiago GladHeather Caydon Feasel, PA-C 10/25/13 1421

## 2013-10-30 ENCOUNTER — Encounter (HOSPITAL_COMMUNITY): Payer: Self-pay | Admitting: Emergency Medicine

## 2013-10-30 ENCOUNTER — Emergency Department (HOSPITAL_COMMUNITY)
Admission: EM | Admit: 2013-10-30 | Discharge: 2013-10-30 | Disposition: A | Discharge status: Home / Self Care | Payer: Medicaid Other | Attending: Emergency Medicine | Admitting: Emergency Medicine

## 2013-10-30 DIAGNOSIS — L02414 Cutaneous abscess of left upper limb: Secondary | ICD-10-CM | POA: Diagnosis not present

## 2013-10-30 MED ORDER — LIDOCAINE-EPINEPHRINE (PF) 2 %-1:200000 IJ SOLN
10.0000 mL | Freq: Once | INTRAMUSCULAR | Status: AC
  Administered 2013-10-30: 10 mL
  Filled 2013-10-30: qty 20

## 2013-10-30 MED ORDER — OXYCODONE-ACETAMINOPHEN 5-325 MG PO TABS
1.0000 | ORAL_TABLET | Freq: Once | ORAL | Status: AC
Start: 2013-10-30 — End: 2013-10-30
  Administered 2013-10-30: 1 via ORAL
  Filled 2013-10-30: qty 1

## 2013-10-30 MED ORDER — IBUPROFEN 800 MG PO TABS: 800.0000 mg | ORAL_TABLET | Freq: Three times a day (TID) | ORAL | Status: DC | PRN

## 2013-10-30 MED ORDER — SULFAMETHOXAZOLE-TRIMETHOPRIM 800-160 MG PO TABS: 2.0000 | ORAL_TABLET | Freq: Two times a day (BID) | ORAL | Status: DC

## 2013-10-30 NOTE — ED Provider Notes (Signed)
CSN: [id number]     Arrival date & time 10/30/13  1453 History  This chart was scribed for non-physician practitioner, Ebbie Ridgehris Inza Mikrut, PA-C working with Doug SouSam Jacubowitz, MD by Greggory StallionKayla Andersen, ED scribe. This patient was seen in room TR05C/TR05C and the patient's care was started at 3:29 PM.   Chief Complaint  Patient presents with  . Abscess   The history is provided by the patient. No language interpreter was used.   HPI Comments: Daryl Avery is a 28 y.o. male who presents to the Emergency Department complaining of an abscess below his left elbow that started 2 days ago. Denies any drainage. Reports mild pain around the area.   History reviewed. No pertinent past medical history. History reviewed. No pertinent past surgical history. History reviewed. No pertinent family history. History  Substance Use Topics  . Smoking status: Never Smoker   . Smokeless tobacco: Not on file  . Alcohol Use: No    Review of Systems All other systems negative except as documented in the HPI. All pertinent positives and negatives as reviewed in the HPI.  Allergies  Review of patient's allergies indicates no known allergies.  Home Medications   Prior to Admission medications   Not on File   BP 119/77  Pulse 78  Temp(Src) 98.5 F (36.9 C) (Oral)  Resp 18  SpO2 98%  Physical Exam  Nursing note and vitals reviewed. Constitutional: He is oriented to person, place, and time. He appears well-developed and well-nourished. No distress.  HENT:  Head: Normocephalic and atraumatic.  Eyes: Conjunctivae and EOM are normal.  Neck: Neck supple. No tracheal deviation present.  Cardiovascular: Normal rate.   Pulmonary/Chest: Effort normal. No respiratory distress.  Musculoskeletal: Normal range of motion.  Indurated area with some swelling just below left elbow.   Neurological: He is alert and oriented to person, place, and time.  Skin: Skin is warm and dry.  Psychiatric: He has a normal mood and  affect. His behavior is normal.    ED Course  Procedures (including critical care time)  INCISION AND DRAINAGE Performed by: Ebbie Ridgehris Jessey Stehlin, PA-C Consent: Verbal consent obtained. Risks and benefits: risks, benefits and alternatives were discussed Type: abscess  Body area: below left elbow  Anesthesia: local infiltration  Incision was made with a scalpel.  Local anesthetic: lidocaine 2% with epinephrine  Anesthetic total: 6 ml  Complexity: complex Blunt dissection to break up loculations  Drainage: purulent  Drainage amount: Moderate   Packing material: 1/4 in iodoform gauze  Patient tolerance: Patient tolerated the procedure well with no immediate complications.   DIAGNOSTIC STUDIES: Oxygen Saturation is 98% on RA, normal by my interpretation.    COORDINATION OF CARE: 3:30 PM-Discussed treatment plan which includes ultrasound with pt at bedside and pt agreed to plan.     I personally performed the services described in this documentation, which was scribed in my presence. The recorded information has been reviewed and is accurate.  Carlyle Dollyhristopher W Teofila Bowery, PA-C 10/30/13 1645

## 2013-10-30 NOTE — ED Provider Notes (Signed)
Medical screening examination/treatment/procedure(s) were performed by non-physician practitioner and as supervising physician I was immediately available for consultation/collaboration.   EKG Interpretation None       Rasaan Brotherton, MD 10/30/13 2356 

## 2013-10-30 NOTE — ED Notes (Signed)
Pt reports abscess to left forearm since Sunday. No acute distress noted at triage.

## 2013-10-30 NOTE — Discharge Instructions (Signed)
Return here as needed. Follow up here in 2 days for a recheck. Keep area covered and clean around the area.

## 2014-03-13 IMAGING — CR DG HIP (WITH OR WITHOUT PELVIS) 2-3V*L*
3 series · 3 of 3 positions shown · non-contrast
Comparison: None.

CLINICAL DATA: Hip pain, no known injury

LEFT HIP - COMPLETE 2+ VIEW

[t pelvis a.p.]
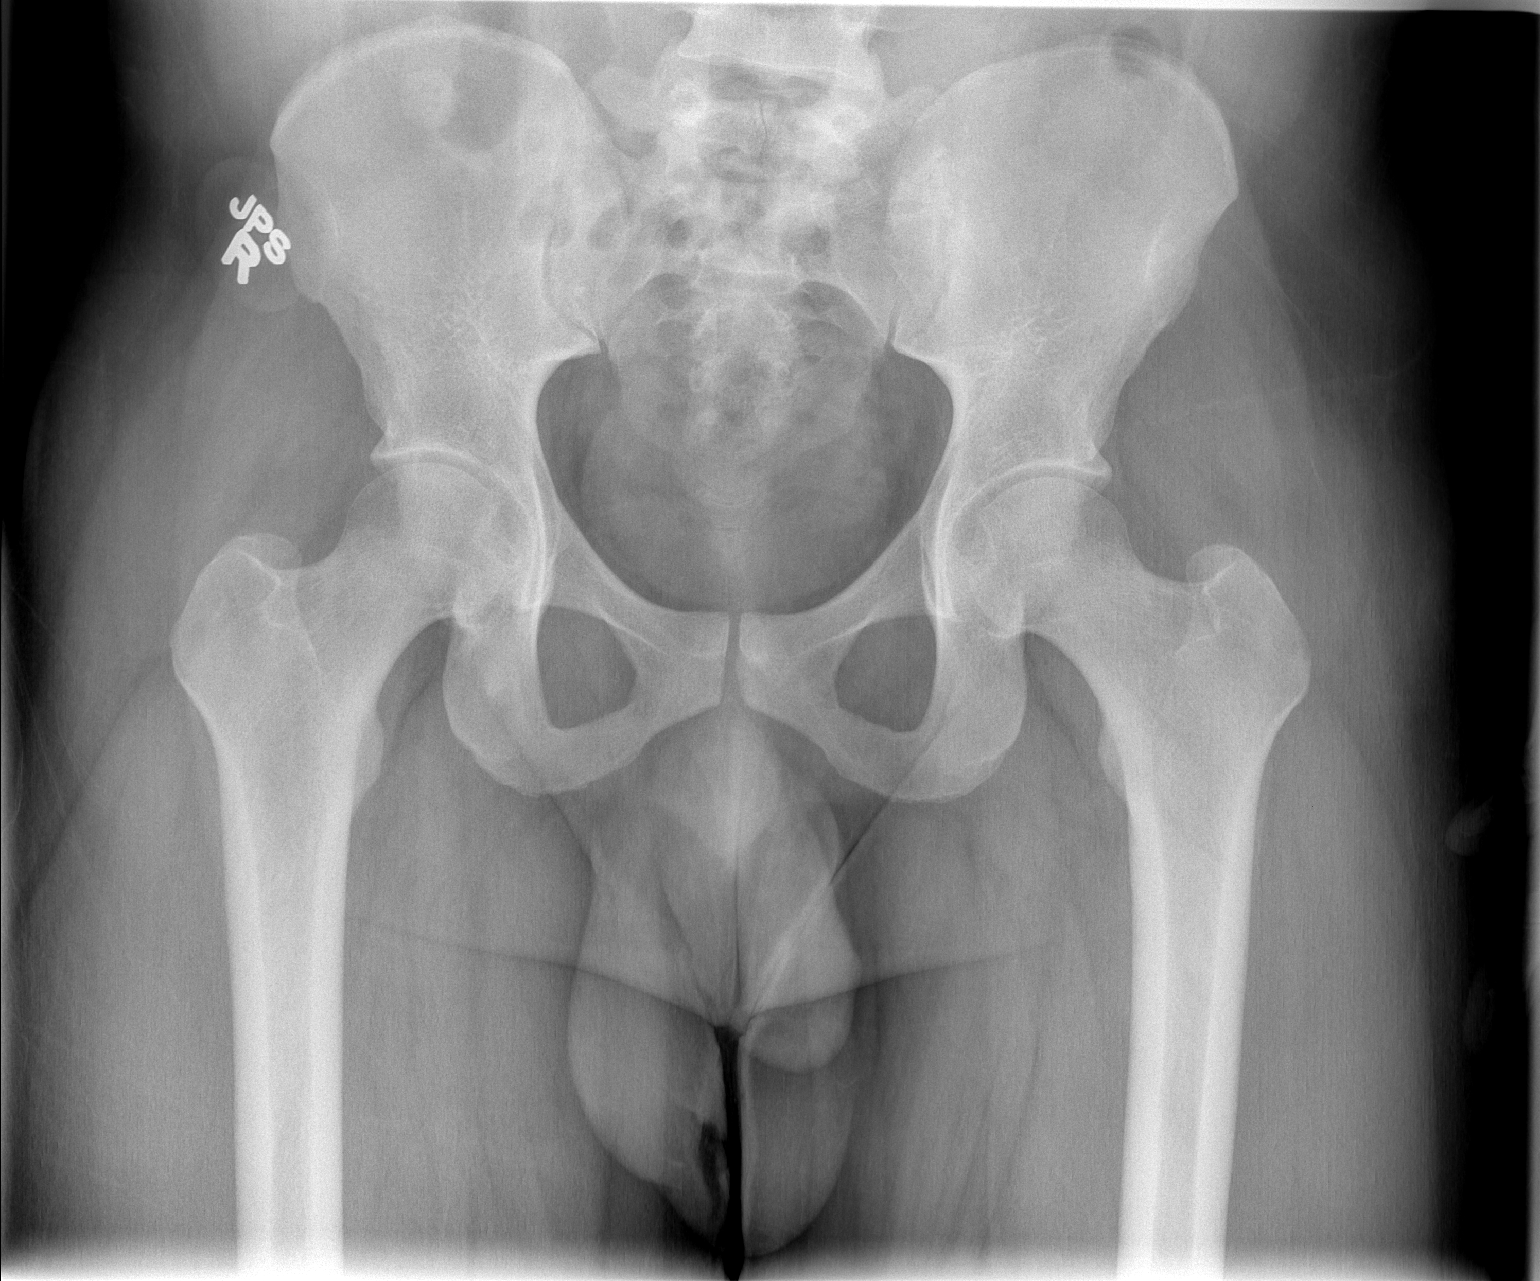

[t hip ap left]
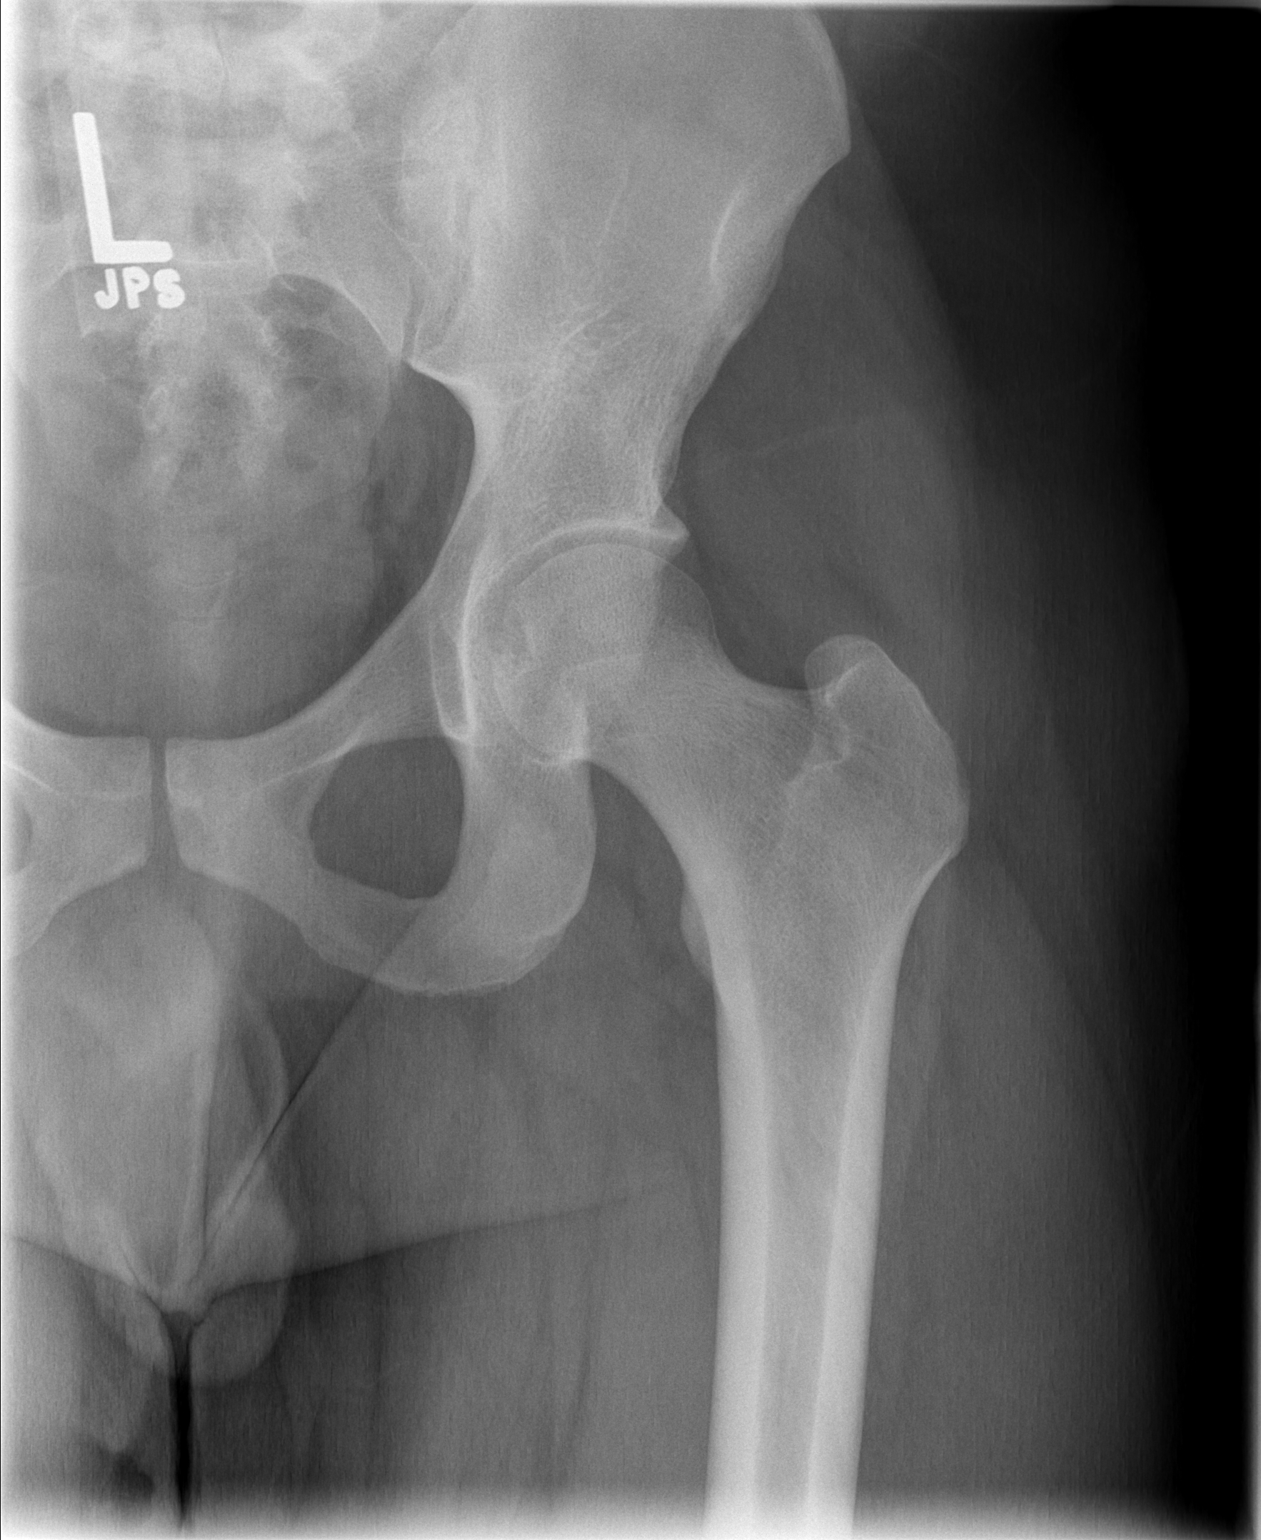

[t hip frog leg left]
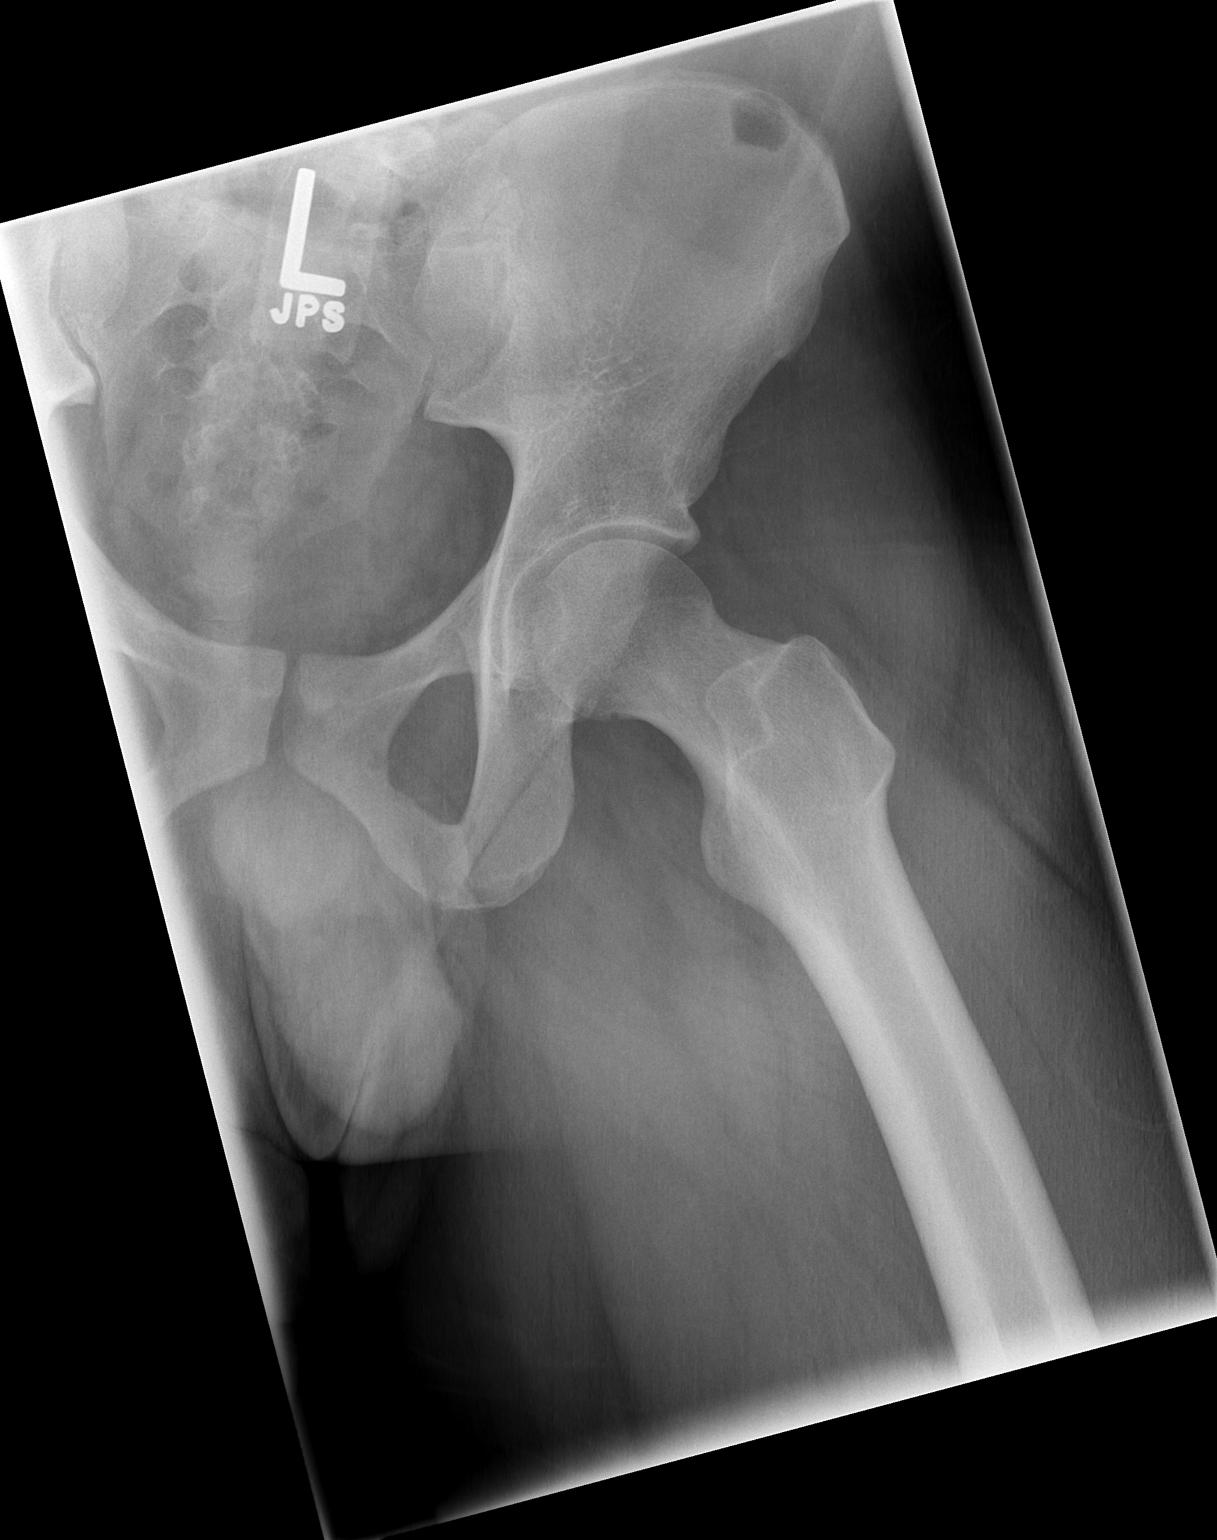

[3 of 3 positions shown; findings below may reference images not displayed]

FINDINGS: Three views of the left hip submitted.  No acute fracture
or subluxation.  Bilateral hip joints and SI joints are symmetrical
in appearance.
IMPRESSION: No acute fracture or subluxation.

## 2019-06-02 ENCOUNTER — Emergency Department (HOSPITAL_COMMUNITY)
Admission: EM | Admit: 2019-06-02 | Discharge: 2019-06-02 | Disposition: A | Discharge status: Home / Self Care | Payer: Medicaid Other | Attending: Emergency Medicine | Admitting: Emergency Medicine

## 2019-06-02 ENCOUNTER — Other Ambulatory Visit: Payer: Self-pay

## 2019-06-02 ENCOUNTER — Encounter (HOSPITAL_COMMUNITY): Payer: Self-pay | Admitting: Emergency Medicine

## 2019-06-02 DIAGNOSIS — M545 Low back pain, unspecified: Secondary | ICD-10-CM

## 2019-06-02 DIAGNOSIS — Y9389 Activity, other specified: Secondary | ICD-10-CM | POA: Diagnosis not present

## 2019-06-02 DIAGNOSIS — Y92410 Unspecified street and highway as the place of occurrence of the external cause: Secondary | ICD-10-CM | POA: Diagnosis not present

## 2019-06-02 DIAGNOSIS — Y999 Unspecified external cause status: Secondary | ICD-10-CM | POA: Diagnosis not present

## 2019-06-02 MED ORDER — LIDOCAINE 5 % EX PTCH: 1.0000 | MEDICATED_PATCH | CUTANEOUS | 0 refills | Status: DC

## 2019-06-02 MED ORDER — METHOCARBAMOL 500 MG PO TABS: 500.0000 mg | ORAL_TABLET | Freq: Two times a day (BID) | ORAL | 0 refills | Status: DC

## 2019-06-02 MED ORDER — NAPROXEN 500 MG PO TABS: 500.0000 mg | ORAL_TABLET | Freq: Two times a day (BID) | ORAL | 0 refills | Status: DC
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
## 2019-06-06 ENCOUNTER — Encounter (HOSPITAL_COMMUNITY): Payer: Self-pay

## 2019-06-06 ENCOUNTER — Ambulatory Visit (HOSPITAL_COMMUNITY)
Admission: EM | Admit: 2019-06-06 | Discharge: 2019-06-06 | Disposition: A | Discharge status: Home / Self Care | Payer: Medicaid Other | Attending: Internal Medicine | Admitting: Internal Medicine

## 2019-06-06 DIAGNOSIS — R030 Elevated blood-pressure reading, without diagnosis of hypertension: Secondary | ICD-10-CM

## 2019-06-06 DIAGNOSIS — H1031 Unspecified acute conjunctivitis, right eye: Secondary | ICD-10-CM

## 2019-06-06 MED ORDER — ERYTHROMYCIN 5 MG/GM OP OINT: TOPICAL_OINTMENT | OPHTHALMIC | 0 refills | Status: AC

## 2019-06-06 MED ORDER — TETRACAINE HCL 0.5 % OP SOLN
OPHTHALMIC | Status: AC
  Filled 2019-06-06: qty 4

## 2019-06-06 NOTE — ED Triage Notes (Signed)
Pt states he was working with a knife and it slipped scratched the top eye lid of right eye. Pt denies vision changes. Pt states he wants to make sure it's not infected. Pt has a small scratch on top outer eye lid.

## 2019-06-06 NOTE — ED Provider Notes (Signed)
[facility]    CSN: [id number] Arrival date & time: 06/06/19  8299      History   Chief Complaint Chief Complaint  Patient presents with  . injured eye    HPI Daryl Avery is a 34 y.o. male comes to the urgent care with 1 day history of right eye discharge.  Patient was working with a sharp metal couple of days ago and sustained a right upper lid injury after the night fell out and bouts of the hard surface.  He had very superficial scratch on the upper eyelid.  He wanted to be sure that he does not have any infection or scratches over his cornea.  He denies any blurred vision or photophobia.Marland Kitchen   HPI  History reviewed. No pertinent past medical history.  There are no problems to display for this patient.   History reviewed. No pertinent surgical history.     Home Medications    Prior to Admission medications   Medication Sig Start Date End Date Taking? Authorizing Provider  erythromycin ophthalmic ointment Place a 1/2 inch ribbon of ointment into the lower eyelid. 06/06/19 06/10/19  Merrilee Jansky, MD  ibuprofen (ADVIL,MOTRIN) 800 MG tablet Take 1 tablet (800 mg total) by mouth every 8 (eight) hours as needed. 10/30/13   Lawyer, Cristal Deer, PA-C  methocarbamol (ROBAXIN) 500 MG tablet Take 1 tablet (500 mg total) by mouth 2 (two) times daily. 06/02/19   Henderly, Britni A, PA-C  naproxen (NAPROSYN) 500 MG tablet Take 1 tablet (500 mg total) by mouth 2 (two) times daily. 06/02/19   Henderly, Britni A, PA-C    Family History Family History  Problem Relation Age of Onset  . Diabetes Mother     Social History Social History   Tobacco Use  . Smoking status: Never Smoker  Substance Use Topics  . Alcohol use: No  . Drug use: No     Allergies   Patient has no known allergies.   Review of Systems Review of Systems  Constitutional: Negative.   HENT: Negative.   Eyes: Positive for discharge. Negative for photophobia, pain, redness, itching and  visual disturbance.  Gastrointestinal: Negative for abdominal pain, diarrhea, nausea and vomiting.  Neurological: Negative for dizziness, light-headedness and headaches.     Physical Exam Triage Vital Signs ED Triage Vitals  Enc Vitals Group     BP 06/06/19 0912 (!) 143/90     Pulse Rate 06/06/19 0912 70     Resp 06/06/19 0912 16     Temp 06/06/19 0912 98 F (36.7 C)     Temp Source 06/06/19 0912 Oral     SpO2 06/06/19 0912 100 %     Weight 06/06/19 0916 168 lb (76.2 kg)     Height 06/06/19 0916 5' (1.524 m)     Head Circumference --      Peak Flow --      Pain Score 06/06/19 0916 0     Pain Loc --      Pain Edu? --      Excl. in GC? --    No data found.  Updated Vital Signs BP (!) 143/90   Pulse 70   Temp 98 F (36.7 C) (Oral)   Resp 16   Ht 5' (1.524 m)   Wt 76.2 kg   SpO2 100%   BMI 32.81 kg/m   Visual Acuity Right Eye Distance:   Left Eye Distance:   Bilateral Distance:    Right Eye Near:  Left Eye Near:    Bilateral Near:     Physical Exam Vitals and nursing note reviewed.  HENT:     Nose: Nose normal. No congestion or rhinorrhea.     Mouth/Throat:     Mouth: Mucous membranes are moist.     Pharynx: No oropharyngeal exudate or posterior oropharyngeal erythema.  Eyes:     Extraocular Movements: Extraocular movements intact.     Conjunctiva/sclera: Conjunctivae normal.     Pupils: Pupils are equal, round, and reactive to light.  Musculoskeletal:     Cervical back: Normal range of motion. No rigidity.  Lymphadenopathy:     Cervical: No cervical adenopathy.      UC Treatments / Results  Labs (all labs ordered are listed, but only abnormal results are displayed) Labs Reviewed - No data to display  EKG   Radiology No results found.  Procedures Procedures (including critical care time)  Medications Ordered in UC Medications - No data to display  Initial Impression / Assessment and Plan / UC Course  I have reviewed the triage vital  signs and the nursing notes.  Pertinent labs & imaging results that were available during my care of the patient were reviewed by me and considered in my medical decision making (see chart for details).     1.  Acute bacterial conjunctivitis: Fluorescein stain is negative for corneal abrasions There was no uptake of fluorescein on the bulbar conjunctiva. Erythromycin eyedrops for the next 4 days Return precautions given  2.  Elevation of blood pressure without history of hypertension: Weight loss Exercise Decrease oral salt intake Primary care physician information given to patient. Final Clinical Impressions(s) / UC Diagnoses   Final diagnoses:  Acute bacterial conjunctivitis of right eye  Elevated blood pressure reading in office without diagnosis of hypertension   Discharge Instructions   None    ED Prescriptions    Medication Sig Dispense Auth. Provider   erythromycin ophthalmic ointment Place a 1/2 inch ribbon of ointment into the lower eyelid. 1 g Orean Giarratano, Myrene Galas, MD     PDMP not reviewed this encounter.   Chase Picket, MD 06/06/19 1101

## 2019-09-03 ENCOUNTER — Encounter (HOSPITAL_BASED_OUTPATIENT_CLINIC_OR_DEPARTMENT_OTHER): Payer: Self-pay | Admitting: *Deleted

## 2019-09-03 ENCOUNTER — Other Ambulatory Visit: Payer: Self-pay

## 2019-09-03 ENCOUNTER — Encounter (HOSPITAL_COMMUNITY): Payer: Self-pay

## 2019-09-03 ENCOUNTER — Emergency Department (HOSPITAL_COMMUNITY)
Admission: EM | Admit: 2019-09-03 | Discharge: 2019-09-03 | Disposition: A | Discharge status: Home / Self Care | Payer: Medicaid Other | Attending: Emergency Medicine | Admitting: Emergency Medicine

## 2019-09-03 ENCOUNTER — Ambulatory Visit (HOSPITAL_COMMUNITY)
Admission: EM | Admit: 2019-09-03 | Discharge: 2019-09-03 | Disposition: A | Discharge status: Home / Self Care | Payer: Medicaid Other

## 2019-09-03 DIAGNOSIS — Y999 Unspecified external cause status: Secondary | ICD-10-CM | POA: Insufficient documentation

## 2019-09-03 DIAGNOSIS — Y929 Unspecified place or not applicable: Secondary | ICD-10-CM | POA: Insufficient documentation

## 2019-09-03 DIAGNOSIS — R202 Paresthesia of skin: Secondary | ICD-10-CM | POA: Diagnosis not present

## 2019-09-03 DIAGNOSIS — S0195XA Open bite of unspecified part of head, initial encounter: Secondary | ICD-10-CM | POA: Insufficient documentation

## 2019-09-03 DIAGNOSIS — T63481A Toxic effect of venom of other arthropod, accidental (unintentional), initial encounter: Secondary | ICD-10-CM | POA: Insufficient documentation

## 2019-09-03 DIAGNOSIS — Y939 Activity, unspecified: Secondary | ICD-10-CM | POA: Insufficient documentation

## 2019-09-03 DIAGNOSIS — W57XXXA Bitten or stung by nonvenomous insect and other nonvenomous arthropods, initial encounter: Secondary | ICD-10-CM | POA: Diagnosis not present

## 2019-09-03 DIAGNOSIS — X58XXXA Exposure to other specified factors, initial encounter: Secondary | ICD-10-CM | POA: Insufficient documentation

## 2019-09-03 DIAGNOSIS — S0990XA Unspecified injury of head, initial encounter: Secondary | ICD-10-CM | POA: Diagnosis present

## 2019-09-03 DIAGNOSIS — Z5321 Procedure and treatment not carried out due to patient leaving prior to being seen by health care provider: Secondary | ICD-10-CM | POA: Insufficient documentation

## 2019-09-03 DIAGNOSIS — S61452A Open bite of left hand, initial encounter: Secondary | ICD-10-CM | POA: Diagnosis not present

## 2019-09-03 MED ORDER — DIPHENHYDRAMINE HCL 25 MG PO CAPS
25.0000 mg | ORAL_CAPSULE | Freq: Once | ORAL | Status: AC
  Administered 2019-09-03: 25 mg via ORAL
  Filled 2019-09-03: qty 1

## 2019-09-03 MED ORDER — FAMOTIDINE 20 MG PO TABS
40.0000 mg | ORAL_TABLET | Freq: Once | ORAL | Status: AC
  Administered 2019-09-03: 40 mg via ORAL
  Filled 2019-09-03: qty 2

## 2019-09-03 MED ORDER — PREDNISONE 50 MG PO TABS
60.0000 mg | ORAL_TABLET | Freq: Once | ORAL | Status: AC
  Administered 2019-09-03: 60 mg via ORAL
  Filled 2019-09-03: qty 1

## 2019-09-03 MED ORDER — DIPHENHYDRAMINE HCL 25 MG PO CAPS
50.0000 mg | ORAL_CAPSULE | Freq: Once | ORAL | Status: AC
  Administered 2019-09-03: 50 mg via ORAL
  Filled 2019-09-03: qty 2

## 2019-09-03 NOTE — ED Notes (Signed)
Na x 2.

## 2019-09-03 NOTE — ED Triage Notes (Signed)
Patient was working this am and was stung by bees to right side of head and left hand, states that his hand is tingling, no respiratory distress. Minimal to no swelling noted

## 2019-09-03 NOTE — ED Triage Notes (Signed)
Pt c/o bee stings x 8 hrs ago to left hand.

## 2019-09-03 NOTE — ED Notes (Signed)
Patient states was just stung on side of head and hands several minutes ago.  Currently NAD.  Advised patient to let me know if symptoms worsen or feels SOB. Patient verbalized understanding. Triage nurse notified.

## 2019-09-03 NOTE — ED Notes (Signed)
Called x 4, no answer. 

## 2019-09-04 ENCOUNTER — Emergency Department (HOSPITAL_BASED_OUTPATIENT_CLINIC_OR_DEPARTMENT_OTHER)
Admission: EM | Admit: 2019-09-04 | Discharge: 2019-09-04 | Disposition: A | Discharge status: Home / Self Care | Payer: Medicaid Other | Source: Home / Self Care | Attending: Emergency Medicine | Admitting: Emergency Medicine

## 2019-09-04 DIAGNOSIS — T63481A Toxic effect of venom of other arthropod, accidental (unintentional), initial encounter: Secondary | ICD-10-CM

## 2019-09-04 MED ORDER — DIPHENHYDRAMINE HCL 25 MG PO TABS: 50.0000 mg | ORAL_TABLET | Freq: Four times a day (QID) | ORAL | 0 refills | Status: DC | PRN

## 2019-09-04 MED ORDER — EPINEPHRINE 0.3 MG/0.3ML IJ SOAJ: INTRAMUSCULAR | 0 refills | Status: DC

## 2019-09-04 NOTE — ED Provider Notes (Signed)
[facility] Provider Note: Lowella Dell, MD, FACEP  CSN: [id number] MRN: [id number] ARRIVAL: 09/03/19 at 2050 ROOM: MHFT2/[facility]FT2   CHIEF COMPLAINT  Bee Stings   HISTORY OF PRESENT ILLNESS  09/04/19 2:19 AM Daryl Avery is a 34 y.o. male who was stung by multiple bees or similar insects about noon yesterday.  He was stung on the arms and face.  He developed moderate shortness of breath and itching and in triage was given Benadryl, Pepcid and prednisone with significant improvement.  He denies any shortness of breath presently.  He has some remaining induration at the sites of the stings.  He had no nausea, vomiting or diarrhea.   History reviewed. No pertinent past medical history.  History reviewed. No pertinent surgical history.  Family History  Problem Relation Age of Onset  . Diabetes Mother     Social History   Tobacco Use  . Smoking status: Never Smoker  Substance Use Topics  . Alcohol use: No  . Drug use: No    Prior to Admission medications   Medication Sig Start Date End Date Taking? Authorizing Provider  diphenhydrAMINE (BENADRYL) 25 MG tablet Take 2 tablets (50 mg total) by mouth every 6 (six) hours as needed (Allergy symptoms). 09/04/19   Erian Rosengren, MD  EPINEPHrine 0.3 mg/0.3 mL IJ SOAJ injection Self inject per package instructions as needed for severe allergic reaction and seek medical attention. 09/04/19   Nelida Mandarino, Jonny Ruiz, MD    Allergies Patient has no known allergies.   REVIEW OF SYSTEMS  Negative except as noted here or in the History of Present Illness.   PHYSICAL EXAMINATION  Initial Vital Signs Pulse 73, temperature 98.1 F (36.7 C), temperature source Oral, resp. rate 18, height 5' (1.524 m), weight 76.2 kg, SpO2 100 %.  Examination General: Well-developed, well-nourished male in no acute distress; appearance consistent with age of record HENT: normocephalic; atraumatic Eyes: pupils equal, round and reactive to light;  extraocular muscles intact Neck: supple Heart: regular rate and rhythm Lungs: clear to auscultation bilaterally Abdomen: soft; nondistended; nontender; bowel sounds present Extremities: No deformity; full range of motion Neurologic: Awake, alert and oriented; motor function intact in all extremities and symmetric; no facial droop Skin: Warm and dry; no urticaria; induration at sites of reported stings Psychiatric: Normal mood and affect   RESULTS  Summary of this visit's results, reviewed and interpreted by myself:   EKG Interpretation  Date/Time:    Ventricular Rate:    PR Interval:    QRS Duration:   QT Interval:    QTC Calculation:   R Axis:     Text Interpretation:        Laboratory Studies: No results found for this or any previous visit (from the past 24 hour(s)). Imaging Studies: No results found.  ED COURSE and MDM  Nursing notes, initial and subsequent vitals signs, including pulse oximetry, reviewed and interpreted by myself.  Vitals:   09/03/19 2056 09/03/19 2058  Pulse:  73  Resp:  18  Temp:  98.1 F (36.7 C)  TempSrc:  Oral  SpO2:  100%  Weight: 76.2 kg   Height: 5' (1.524 m)    Medications  diphenhydrAMINE (BENADRYL) capsule 50 mg (50 mg Oral Given 09/03/19 2102)  famotidine (PEPCID) tablet 40 mg (40 mg Oral Given 09/03/19 2102)  predniSONE (DELTASONE) tablet 60 mg (60 mg Oral Given 09/03/19 2102)   We will provide the patient with prescription for an EpiPen should he have a severe  reaction in the future.  He was advised he may continue taking over-the-counter Benadryl as needed for itching and other minor symptoms.   PROCEDURES  Procedures   ED DIAGNOSES     ICD-10-CM   1. Insect sting, accidental or unintentional, initial encounter  T63.481A   2. Allergic reaction to insect sting, accidental or unintentional, initial encounter  B09.628Z        Paula Libra, MD 09/04/19 [phone]

## 2019-12-23 ENCOUNTER — Ambulatory Visit (HOSPITAL_COMMUNITY)
Admission: EM | Admit: 2019-12-23 | Discharge: 2019-12-23 | Disposition: A | Discharge status: Home / Self Care | Payer: Medicaid Other | Attending: Family Medicine | Admitting: Family Medicine

## 2019-12-23 ENCOUNTER — Encounter (HOSPITAL_COMMUNITY): Payer: Self-pay

## 2019-12-23 ENCOUNTER — Other Ambulatory Visit: Payer: Self-pay

## 2019-12-23 DIAGNOSIS — G43011 Migraine without aura, intractable, with status migrainosus: Secondary | ICD-10-CM | POA: Diagnosis not present

## 2019-12-23 MED ORDER — DEXAMETHASONE 4 MG PO TABS: 4.0000 mg | ORAL_TABLET | Freq: Two times a day (BID) | ORAL | 0 refills | Status: DC

## 2019-12-23 MED ORDER — METOPROLOL TARTRATE 50 MG PO TABS: 50.0000 mg | ORAL_TABLET | Freq: Every day | ORAL | 1 refills | Status: DC

## 2019-12-23 NOTE — ED Triage Notes (Signed)
Pt presents with complaints of frontal headache and blurry vision x 2 days. Reports history of migraine and that this feels the same. Pt is hypertensive during intake. Denies hx of htn.

## 2019-12-23 NOTE — ED Provider Notes (Signed)
[facility]    CSN: [id number] Arrival date & time: 12/23/19  1526      History   Chief Complaint Chief Complaint  Patient presents with  . Migraine    HPI Daryl Avery is a 34 y.o. male.   Is a 34 year old established [place] urgent care patient who presents with headache.  Pt presents with complaints of frontal headache and blurry vision x 2 days. Reports history of migraine and that this feels the same. Pt is hypertensive during intake. Denies hx of htn.  Patient says he has had a headache like this before but the most recent one was a month ago.  Patient works in [employer].  He left early from work today.     History reviewed. No pertinent past medical history.  There are no problems to display for this patient.   History reviewed. No pertinent surgical history.     Home Medications    Prior to Admission medications   Medication Sig Start Date End Date Taking? Authorizing Provider  dexamethasone (DECADRON) 4 MG tablet Take 1 tablet (4 mg total) by mouth 2 (two) times daily with a meal. 12/23/19   Elvina Sidle, MD  diphenhydrAMINE (BENADRYL) 25 MG tablet Take 2 tablets (50 mg total) by mouth every 6 (six) hours as needed (Allergy symptoms). 09/04/19   Molpus, John, MD  EPINEPHrine 0.3 mg/0.3 mL IJ SOAJ injection Self inject per package instructions as needed for severe allergic reaction and seek medical attention. 09/04/19   Molpus, John, MD  metoprolol tartrate (LOPRESSOR) 50 MG tablet Take 1 tablet (50 mg total) by mouth daily. 12/23/19   Elvina Sidle, MD    Family History Family History  Problem Relation Age of Onset  . Diabetes Mother     Social History Social History   Tobacco Use  . Smoking status: Never Smoker  Substance Use Topics  . Alcohol use: No  . Drug use: No     Allergies   Patient has no known allergies.   Review of Systems Review of Systems  Constitutional: Negative.   Gastrointestinal: Negative.    Neurological: Positive for headaches.     Physical Exam Triage Vital Signs ED Triage Vitals [12/23/19 1642]  Enc Vitals Group     BP (!) 161/100     Pulse Rate 69     Resp 19     Temp 98.5 F (36.9 C)     Temp src      SpO2 98 %     Weight      Height      Head Circumference      Peak Flow      Pain Score 6     Pain Loc      Pain Edu?      Excl. in GC?    No data found.  Updated Vital Signs BP (!) 161/100   Pulse 69   Temp 98.5 F (36.9 C)   Resp 19   SpO2 98%    Physical Exam Vitals and nursing note reviewed.  Constitutional:      Appearance: Normal appearance. He is obese.  HENT:     Head: Normocephalic.     Right Ear: Tympanic membrane normal.     Left Ear: Tympanic membrane normal.     Nose: Nose normal.     Mouth/Throat:     Mouth: Mucous membranes are moist.     Pharynx: Oropharynx is clear.  Eyes:     Conjunctiva/sclera:  Conjunctivae normal.  Cardiovascular:     Rate and Rhythm: Normal rate.  Pulmonary:     Effort: Pulmonary effort is normal.  Musculoskeletal:        General: Normal range of motion.     Cervical back: Normal range of motion.  Skin:    General: Skin is warm and dry.  Neurological:     General: No focal deficit present.     Mental Status: He is alert.  Psychiatric:        Mood and Affect: Mood normal.        Behavior: Behavior normal.        Thought Content: Thought content normal.      UC Treatments / Results  Labs (all labs ordered are listed, but only abnormal results are displayed) Labs Reviewed - No data to display  EKG   Radiology No results found.  Procedures Procedures (including critical care time)  Medications Ordered in UC Medications - No data to display  Initial Impression / Assessment and Plan / UC Course  I have reviewed the triage vital signs and the nursing notes.  Pertinent labs & imaging results that were available during my care of the patient were reviewed by me and considered in my  medical decision making (see chart for details).    Final Clinical Impressions(s) / UC Diagnoses   Final diagnoses:  Intractable migraine without aura and with status migrainosus   Discharge Instructions   None    ED Prescriptions    Medication Sig Dispense Auth. Provider   metoprolol tartrate (LOPRESSOR) 50 MG tablet Take 1 tablet (50 mg total) by mouth daily. 10 tablet Elvina Sidle, MD   dexamethasone (DECADRON) 4 MG tablet Take 1 tablet (4 mg total) by mouth 2 (two) times daily with a meal. 3 tablet Elvina Sidle, MD     I have reviewed the PDMP during this encounter.   Elvina Sidle, MD 12/23/19 1721

## 2020-01-22 ENCOUNTER — Other Ambulatory Visit: Payer: Medicaid Other

## 2020-11-02 ENCOUNTER — Ambulatory Visit (INDEPENDENT_AMBULATORY_CARE_PROVIDER_SITE_OTHER): Payer: Medicaid Other | Admitting: Nurse Practitioner

## 2020-11-02 ENCOUNTER — Other Ambulatory Visit: Payer: Self-pay | Admitting: Nurse Practitioner

## 2020-11-02 ENCOUNTER — Encounter: Payer: Self-pay | Admitting: Nurse Practitioner

## 2020-11-02 DIAGNOSIS — R051 Acute cough: Secondary | ICD-10-CM | POA: Diagnosis not present

## 2020-11-02 MED ORDER — PSEUDOEPH-BROMPHEN-DM 30-2-10 MG/5ML PO SYRP: 5.0000 mL | ORAL_SOLUTION | Freq: Three times a day (TID) | ORAL | 0 refills | Status: AC | PRN

## 2020-11-02 MED ORDER — LEVOCETIRIZINE DIHYDROCHLORIDE 5 MG PO TABS: 5.0000 mg | ORAL_TABLET | Freq: Every evening | ORAL | 0 refills | Status: DC

## 2020-11-02 NOTE — Patient Instructions (Addendum)
Cough Nasal congestion:  Most likely seasonal allergies or viral URI  Encouraged to get tested for COVID  Will order antihistamine  Will order cough medicine   Stay well hydrated  Follow up:  Follow up if needed

## 2020-11-02 NOTE — Progress Notes (Signed)
Virtual Visit via Telephone Note  I connected with Daryl Avery on 11/02/20 at 10:20 AM EDT by telephone and verified that I am speaking with the correct person using two identifiers.  Location: Patient: home Provider: office   I discussed the limitations, risks, security and privacy concerns of performing an evaluation and management service by telephone and the availability of in person appointments. I also discussed with the patient that there may be a patient responsible charge related to this service. The patient expressed understanding and agreed to proceed.   History of Present Illness:   Patient presents today for acute visit through televisit.  Patient states that he has had a cough for the past couple days.  He denies any fever or shortness of breath.  We discussed that he can go and get COVID testing if he feels like he needs to.  He states that he has had clear nasal drainage. Denies f/c/s, n/v/d, hemoptysis, PND, chest pain or edema.      Observations/Objective:  Vitals with BMI 12/23/2019 09/04/2019 09/03/2019  Height - - -  Weight - - -  BMI - - -  Systolic 161 132 -  Diastolic 700 70 -  Pulse 69 82 73      Assessment and Plan:  Cough Nasal congestion:  Most likely seasonal allergies or viral URI  Encouraged to get tested for COVID  Will order antihistamine  Will order cough medicine   Stay well hydrated  Follow up:  Follow up if needed     I discussed the assessment and treatment plan with the patient. The patient was provided an opportunity to ask questions and all were answered. The patient agreed with the plan and demonstrated an understanding of the instructions.   The patient was advised to call back or seek an in-person evaluation if the symptoms worsen or if the condition fails to improve as anticipated.  I provided 23 minutes of non-face-to-face time during this encounter.   Daryl Andrew, NP

## 2021-10-20 ENCOUNTER — Ambulatory Visit (HOSPITAL_COMMUNITY)
Admission: EM | Admit: 2021-10-20 | Discharge: 2021-10-20 | Disposition: A | Discharge status: Home / Self Care | Payer: Medicaid Other | Attending: Family Medicine | Admitting: Family Medicine

## 2021-10-20 ENCOUNTER — Encounter (HOSPITAL_COMMUNITY): Payer: Self-pay | Admitting: Emergency Medicine

## 2021-10-20 DIAGNOSIS — H18892 Other specified disorders of cornea, left eye: Secondary | ICD-10-CM

## 2021-10-20 MED ORDER — GENTAMICIN SULFATE 0.3 % OP SOLN: 2.0000 [drp] | Freq: Three times a day (TID) | OPHTHALMIC | 0 refills | Status: AC

## 2021-10-20 MED ORDER — TETRACAINE HCL 0.5 % OP SOLN
OPHTHALMIC | Status: AC
  Filled 2021-10-20: qty 4

## 2021-10-20 NOTE — Discharge Instructions (Signed)
Put gentamicin antibiotic drops in your left eye 3 times a day for 5 days.  Cool compresses may help how it feels.

## 2021-10-20 NOTE — ED Provider Notes (Signed)
[facility]    CSN: [id number] Arrival date & time: 10/20/21  1827      History   Chief Complaint Chief Complaint  Patient presents with   eye problem    HPI Daryl Avery is a 36 y.o. male.   HPI Here for left eye redness and irritation that is been going on for about 2 days.  He wonders if he got something in it, he has had constant tearing but has not really noted any dried mattering in the mornings when he wakes up.  No blow or trauma like that to his eye.  He works in [employer] History reviewed. No pertinent past medical history.  Patient Active Problem List   Diagnosis Date Noted   Acute cough 11/02/2020    History reviewed. No pertinent surgical history.     Home Medications    Prior to Admission medications   Medication Sig Start Date End Date Taking? Authorizing Provider  gentamicin (GARAMYCIN) 0.3 % ophthalmic solution Place 2 drops into the left eye 3 (three) times daily for 5 days. 10/20/21 10/25/21 Yes Zenia Resides, MD  EPINEPHrine 0.3 mg/0.3 mL IJ SOAJ injection Self inject per package instructions as needed for severe allergic reaction and seek medical attention. 09/04/19   Molpus, Jonny Ruiz, MD  levocetirizine (XYZAL ALLERGY 24HR) 5 MG tablet Take 1 tablet (5 mg total) by mouth every evening. 11/02/20 12/02/20  Ivonne Andrew, NP  metoprolol tartrate (LOPRESSOR) 50 MG tablet Take 1 tablet (50 mg total) by mouth daily. 12/23/19   Elvina Sidle, MD    Family History Family History  Problem Relation Age of Onset   Diabetes Mother     Social History Social History   Tobacco Use   Smoking status: Never  Substance Use Topics   Alcohol use: No   Drug use: No     Allergies   Patient has no known allergies.   Review of Systems Review of Systems   Physical Exam Triage Vital Signs ED Triage Vitals [10/20/21 1917]  Enc Vitals Group     BP (!) 153/101     Pulse Rate 72     Resp 16     Temp 98.2 F (36.8 C)     Temp Source  Oral     SpO2 100 %     Weight      Height      Head Circumference      Peak Flow      Pain Score 0     Pain Loc      Pain Edu?      Excl. in GC?    No data found.  Updated Vital Signs BP (!) 153/101 (BP Location: Left Arm)   Pulse 72   Temp 98.2 F (36.8 C) (Oral)   Resp 16   SpO2 100%   Visual Acuity Right Eye Distance: 20/40 Left Eye Distance: 20/40 Bilateral Distance: 20/25  Right Eye Near:   Left Eye Near:    Bilateral Near:     Physical Exam Vitals reviewed.  Constitutional:      General: He is not in acute distress.    Appearance: He is not ill-appearing, toxic-appearing or diaphoretic.  HENT:     Nose: Nose normal.  Eyes:     Extraocular Movements: Extraocular movements intact.     Pupils: Pupils are equal, round, and reactive to light.     Comments: The left eye is injected.  Lids are nonswollen.  Neurological:  Mental Status: He is alert and oriented to person, place, and time.  Psychiatric:        Behavior: Behavior normal.      UC Treatments / Results  Labs (all labs ordered are listed, but only abnormal results are displayed) Labs Reviewed - No data to display  EKG   Radiology No results found.  Procedures Procedures (including critical care time)  Medications Ordered in UC Medications - No data to display  Initial Impression / Assessment and Plan / UC Course  I have reviewed the triage vital signs and the nursing notes.  Pertinent labs & imaging results that were available during my care of the patient were reviewed by me and considered in my medical decision making (see chart for details).        After consent is given tetracaine drops were applied to the left eye.  Fluorescein stain is then done.  With the [facility], there is no sign of abrasion.  The eye is flushed with saline.  I will send in gent drops and he is given contact information for ophthalmology, in case it does not improve. Final Clinical Impressions(s) /  UC Diagnoses   Final diagnoses:  Corneal irritation of left eye     Discharge Instructions      Put gentamicin antibiotic drops in your left eye 3 times a day for 5 days.  Cool compresses may help how it feels.     ED Prescriptions     Medication Sig Dispense Auth. Provider   gentamicin (GARAMYCIN) 0.3 % ophthalmic solution Place 2 drops into the left eye 3 (three) times daily for 5 days. 5 mL Barrett Henle, MD      PDMP not reviewed this encounter.   Barrett Henle, MD 10/20/21 Joen Laura

## 2021-10-20 NOTE — ED Triage Notes (Signed)
Pt reports left eye redness x 2 days. States the sunlight causes left eye to burn.

## 2022-02-24 ENCOUNTER — Ambulatory Visit (INDEPENDENT_AMBULATORY_CARE_PROVIDER_SITE_OTHER): Payer: Medicaid Other | Admitting: Pulmonary Disease

## 2022-02-24 ENCOUNTER — Ambulatory Visit (INDEPENDENT_AMBULATORY_CARE_PROVIDER_SITE_OTHER): Payer: Medicaid Other

## 2022-02-24 ENCOUNTER — Encounter: Payer: Self-pay | Admitting: Pulmonary Disease

## 2022-02-24 VITALS — HR 104 | Ht 60.0 in | Wt 183.4 lb

## 2022-02-24 DIAGNOSIS — G4730 Sleep apnea, unspecified: Secondary | ICD-10-CM | POA: Diagnosis not present

## 2022-02-24 DIAGNOSIS — R051 Acute cough: Secondary | ICD-10-CM

## 2022-02-24 DIAGNOSIS — R059 Cough, unspecified: Secondary | ICD-10-CM | POA: Diagnosis not present

## 2022-02-24 NOTE — Patient Instructions (Signed)
Nice to meet you  I ordered a chest x-ray for your cough  Ordered an in lab sleep study, split-night study to evaluate for sleep apnea.  I am suspicious that this is present.  We will have to determine follow-up based on results of sleep test and the timing of receiving CPAP machine if you have sleep apnea on the sleep test.

## 2022-02-24 NOTE — Progress Notes (Signed)
@Patient  ID: Daryl Avery, male    DOB: [birth year], 37 y.o.   MRN: [id number]  Chief Complaint  Patient presents with   Consult    Pt is here for consult for possible sleep apnea. Pts states gf reported that he stops breathing during sleep. He denies waking up feeling tired. Denies morning headaches. Never done sleep study before. Some SOB when he wakes up. Denies any sleep aides at night. Pt denies normal sleep routine.     Referring provider: No ref. provider found  HPI:   37 y.o. whom we are seeing as a self-referral for evaluation of possible sleep apnea.  Reports he has been told he stops breathing at night.  Almost on a nightly basis.  He snores loudly.  He separated fatigued or tired during the day.  Not short of breath.  No dyspnea on exertion.  Stop bang of 5, Epworth sleepiness scale of 11.  Discussed screenings for high risk of OSA and recommend sleep apnea test, polysomnography.  Mother asked him to discuss his cough.  Has been present for his report for 1 day.  Is dry.  He denies any fever, no chills.  No chest congestion.  No sore throat.  No runny nose.  No nasal congestion.  He states that came on after he was in the crawl space out of the house.  Feels like he inhaled some dust.  Denies any past history of with coughing.  Denies any history of asthma.  Again, no shortness of breath.  PMH: Reviewed, denies any Surgical history:History reviewed. No pertinent surgical history. Family history: He reports mother with diabetes, Social history: Lives in [place], reports a history of smoking   Questionaires / Pulmonary Flowsheets:   ACT:      No data to display          MMRC:     No data to display          Epworth:     02/24/2022    9:00 AM  Results of the Epworth flowsheet  Sitting and reading 0  Watching TV 0  Sitting, inactive in a public place (e.g. a theatre or a meeting) 0  As a passenger in a car for an hour without a break 3  Lying down to  rest in the afternoon when circumstances permit 3  Sitting and talking to someone 0  Sitting quietly after a lunch without alcohol 3  In a car, while stopped for a few minutes in traffic 2  Total score 11    Tests:   FENO:  No results found for: "NITRICOXIDE"  PFT:     No data to display          WALK:      No data to display          Imaging: Personally reviewed and as per EMR and discussion in this note No results found.  Lab Results: Personally reviewed CBC No results found for: "WBC", "RBC", "HGB", "HCT", "PLT", "MCV", "MCH", "MCHC", "RDW", "LYMPHSABS", "MONOABS", "EOSABS", "BASOSABS"  BMET No results found for: "NA", "K", "CL", "CO2", "GLUCOSE", "BUN", "CREATININE", "CALCIUM", "GFRNONAA", "GFRAA"  BNP No results found for: "BNP"  ProBNP No results found for: "PROBNP"  Specialty Problems       Pulmonary Problems   Acute cough    No Known Allergies   There is no immunization history on file for this patient.  History reviewed. No pertinent past medical history.  Tobacco History: Social  History   Tobacco Use  Smoking Status Every Day   Types: Cigars  Smokeless Tobacco Not on file  Tobacco Comments   Pt reports smoking 1 black and mild a day. ALS 02/24/2022   Ready to quit: Not Answered Counseling given: Not Answered Tobacco comments: Pt reports smoking 1 black and mild a day. ALS 02/24/2022   Continue to not smoke  Outpatient Encounter Medications as of 02/24/2022  Medication Sig   EPINEPHrine 0.3 mg/0.3 mL IJ SOAJ injection Self inject per package instructions as needed for severe allergic reaction and seek medical attention.   metoprolol tartrate (LOPRESSOR) 50 MG tablet Take 1 tablet (50 mg total) by mouth daily.   levocetirizine (XYZAL ALLERGY 24HR) 5 MG tablet Take 1 tablet (5 mg total) by mouth every evening.   No facility-administered encounter medications on file as of 02/24/2022.     Review of Systems  Review of Systems  No  chest pain with exertion.  No orthopnea or PND.  Comprehensive review of systems otherwise negative. Physical Exam  Pulse (!) 104   Ht 5' (1.524 m)   Wt 183 lb 6.4 oz (83.2 kg)   SpO2 96%   BMI 35.82 kg/m   Wt Readings from Last 5 Encounters:  02/24/22 183 lb 6.4 oz (83.2 kg)  09/03/19 168 lb (76.2 kg)  06/06/19 168 lb (76.2 kg)  06/02/19 168 lb (76.2 kg)  07/30/13 153 lb 4.8 oz (69.5 kg)    BMI Readings from Last 5 Encounters:  02/24/22 35.82 kg/m  09/03/19 32.81 kg/m  06/06/19 32.81 kg/m  06/02/19 32.81 kg/m  10/25/13 29.94 kg/m     Physical Exam General: Sitting in chair, no acute distress Eyes: EOMI, no icterus Neck: Supple, No JVP Pulmonary: Clear, normal work of breathing Abdomen: Nondistended, bowel sounds present Cardiovascular: Warm, no edema MSK: No synovitis, no joint effusion Neuro: Normal gait, no weakness Psych: Normal mood, full affect    Assessment & Plan:   Sleep Apnea: STOPBANG of 5. Epworth 11. Recommended home sleep test. Mother on phone insists on in lab study. Spilt night study ordered.   Cough: Present for a day or so.  Started after working in a crawl space.  Thinks he inhaled some dust.  Sounds like irritant.  Lung exam clear.  No fever or chills.  No sore throat.  No nasal congestion.  Low suspicion for infection.  Chest x-ray for further evaluation at the request of mother.   Return if symptoms worsen or fail to improve.  Based on results of sleep test.   Lanier Clam, MD 02/24/2022

## 2022-02-28 NOTE — Progress Notes (Signed)
Chest xray is clear

## 2022-03-31 ENCOUNTER — Encounter: Payer: Self-pay | Admitting: Nurse Practitioner

## 2022-03-31 ENCOUNTER — Ambulatory Visit: Payer: Medicaid Other | Admitting: Nurse Practitioner

## 2022-03-31 VITALS — BP 128/86 | HR 65 | Temp 97.8°F | Ht 60.0 in | Wt 178.2 lb

## 2022-03-31 DIAGNOSIS — Z6834 Body mass index (BMI) 34.0-34.9, adult: Secondary | ICD-10-CM

## 2022-03-31 DIAGNOSIS — Z0001 Encounter for general adult medical examination with abnormal findings: Secondary | ICD-10-CM

## 2022-03-31 DIAGNOSIS — Z131 Encounter for screening for diabetes mellitus: Secondary | ICD-10-CM

## 2022-03-31 DIAGNOSIS — E669 Obesity, unspecified: Secondary | ICD-10-CM

## 2022-03-31 DIAGNOSIS — E66811 Obesity, class 1: Secondary | ICD-10-CM | POA: Insufficient documentation

## 2022-03-31 DIAGNOSIS — R0681 Apnea, not elsewhere classified: Secondary | ICD-10-CM

## 2022-03-31 LAB — COMPREHENSIVE METABOLIC PANEL
ALT: 39 U/L (ref 0–53)
AST: 31 U/L (ref 0–37)
Albumin: 4.3 g/dL (ref 3.5–5.2)
Alkaline Phosphatase: 71 U/L (ref 39–117)
BUN: 11 mg/dL (ref 6–23)
CO2: 28 mEq/L (ref 19–32)
Calcium: 9.2 mg/dL (ref 8.4–10.5)
Chloride: 102 mEq/L (ref 96–112)
Creatinine, Ser: 0.86 mg/dL (ref 0.40–1.50)
GFR: 111.27 mL/min (ref >60.00)
Glucose, Bld: 100 mg/dL — ABNORMAL HIGH (ref 70–99)
Potassium: 3.9 mEq/L (ref 3.5–5.1)
Sodium: 139 mEq/L (ref 135–145)
Total Bilirubin: 0.8 mg/dL (ref 0.2–1.2)
Total Protein: 7.5 g/dL (ref 6.0–8.3)

## 2022-03-31 LAB — LIPID PANEL
Cholesterol: 183 mg/dL (ref 0–200)
HDL: 36.7 mg/dL — ABNORMAL LOW (ref >39.00)
LDL Cholesterol: 122 mg/dL — ABNORMAL HIGH (ref 0–99)
NonHDL: 146.73
Total CHOL/HDL Ratio: 5
Triglycerides: 125 mg/dL (ref 0.0–149.0)
VLDL: 25 mg/dL (ref 0.0–40.0)

## 2022-03-31 LAB — CBC
HCT: 44.6 % (ref 39.0–52.0)
Hemoglobin: 15.8 g/dL (ref 13.0–17.0)
MCHC: 35.3 g/dL (ref 30.0–36.0)
MCV: 89.8 fl (ref 78.0–100.0)
Platelets: 236 10*3/uL (ref 150.0–400.0)
RBC: 4.97 Mil/uL (ref 4.22–5.81)
RDW: 13.7 % (ref 11.5–15.5)
WBC: 6.8 10*3/uL (ref 4.0–10.5)

## 2022-03-31 LAB — HEMOGLOBIN A1C: Hgb A1c MFr Bld: 6.3 % (ref 4.6–6.5)

## 2022-03-31 LAB — TSH: TSH: 0.89 u[IU]/mL (ref 0.35–5.50)

## 2022-03-31 NOTE — Assessment & Plan Note (Signed)
Encouraged patient to follow-up with pulmonology for sleep study as scheduled.

## 2022-03-31 NOTE — Assessment & Plan Note (Signed)
Discussed healthy lifestyle including diet and exercise with a goal of 150 min/week.Patient reports understanding, elected to defer tdap today. Does not wish to undergo Hep C or STI screening. Encouraged to call office for Tdap if he experiences a cut or burn.

## 2022-03-31 NOTE — Progress Notes (Signed)
New Patient Office Visit  Subjective    Patient ID: Daryl Avery, male    DOB: July 13, 1985  Age: 37 y.o. MRN: [id number]  CC:  Chief Complaint  Patient presents with   New Patient (Initial Visit)    Establish care     HPI Daryl Avery presents to establish care Has no acute concerns, was recommended to establish care by his mother. Has not been to see a PCP in 18 years. Reports has had witnessed apneic episodes is established with pulmonology awaiting to undergo a sleep study. Due for tdap, STI  screening, and Hep C screening. Is fasting today.   Outpatient Encounter Medications as of 03/31/2022  Medication Sig   [DISCONTINUED] metoprolol tartrate (LOPRESSOR) 50 MG tablet Take 1 tablet (50 mg total) by mouth daily.   [DISCONTINUED] EPINEPHrine 0.3 mg/0.3 mL IJ SOAJ injection Self inject per package instructions as needed for severe allergic reaction and seek medical attention.   [DISCONTINUED] levocetirizine (XYZAL ALLERGY 24HR) 5 MG tablet Take 1 tablet (5 mg total) by mouth every evening.   No facility-administered encounter medications on file as of 03/31/2022.    No past medical history on file.  No past surgical history on file.  Family History  Problem Relation Age of Onset   Diabetes Mother    Sleep apnea Father     Social History   Socioeconomic History   Marital status: Divorced    Spouse name: Not on file   Number of children: Not on file   Years of education: Not on file   Highest education level: Not on file  Occupational History   Not on file  Tobacco Use   Smoking status: Every Day    Types: Cigars   Smokeless tobacco: Not on file   Tobacco comments:    Pt reports smoking 1 black and mild a day. ALS 02/24/2022  Substance and Sexual Activity   Alcohol use: Yes    Comment: social   Drug use: No   Sexual activity: Yes    Birth control/protection: None  Other Topics Concern   Not on file  Social History Narrative   Not on file   Social  Determinants of Health   Financial Resource Strain: Not on file  Food Insecurity: Not on file  Transportation Needs: Not on file  Physical Activity: Not on file  Stress: Not on file  Social Connections: Not on file  Intimate Partner Violence: Not on file    Review of Systems  Constitutional:  Negative for fever and malaise/fatigue.  Respiratory:  Negative for cough and shortness of breath.   Cardiovascular:  Negative for chest pain.  Gastrointestinal:  Negative for abdominal pain and blood in stool.  Neurological:  Negative for dizziness, seizures, loss of consciousness and headaches.  Psychiatric/Behavioral:  Negative for depression and suicidal ideas. The patient is not nervous/anxious.         Objective    BP 128/86   Pulse 65   Temp 97.8 F (36.6 C) (Oral)   Ht 5' (1.524 m)   Wt 178 lb 4 oz (80.9 kg)   SpO2 97%   BMI 34.81 kg/m   Physical Exam Vitals reviewed.  Constitutional:      General: He is not in acute distress.    Appearance: Normal appearance. He is not ill-appearing.  HENT:     Head: Normocephalic and atraumatic.     Right Ear: Tympanic membrane, ear canal and external ear normal.  Left Ear: Tympanic membrane, ear canal and external ear normal.  Eyes:     General: No scleral icterus.    Extraocular Movements: Extraocular movements intact.     Conjunctiva/sclera: Conjunctivae normal.     Pupils: Pupils are equal, round, and reactive to light.  Neck:     Vascular: No carotid bruit.  Cardiovascular:     Rate and Rhythm: Normal rate and regular rhythm.     Pulses: Normal pulses.     Heart sounds: Normal heart sounds.  Pulmonary:     Effort: Pulmonary effort is normal.     Breath sounds: Normal breath sounds.  Abdominal:     General: Bowel sounds are normal. There is no distension.     Palpations: There is no mass.     Tenderness: There is no abdominal tenderness.     Hernia: No hernia is present.  Musculoskeletal:        General: No  swelling or tenderness.     Cervical back: Normal range of motion and neck supple. No rigidity.  Lymphadenopathy:     Cervical: No cervical adenopathy.  Skin:    General: Skin is warm and dry.  Neurological:     General: No focal deficit present.     Mental Status: He is alert and oriented to person, place, and time.     Cranial Nerves: No cranial nerve deficit.     Sensory: No sensory deficit.     Motor: No weakness.     Gait: Gait normal.  Psychiatric:        Mood and Affect: Mood normal.        Behavior: Behavior normal.        Judgment: Judgment normal.         Assessment & Plan:   Problem List Items Addressed This Visit       Other   Class 1 obesity without serious comorbidity with body mass index (BMI) of 34.0 to 34.9 in adult    Labs ordered today for further evaluation, Further recommendations may be made based on these results. Encouraged healthy diet of fruits, veggies, lean proteins and 150 mins of exercise per week.        Relevant Orders   CBC   Comprehensive metabolic panel   Hemoglobin A1c   TSH   Lipid panel   Encounter for general adult medical examination with abnormal findings - Primary    Discussed healthy lifestyle including diet and exercise with a goal of 150 min/week.Patient reports understanding, elected to defer tdap today. Does not wish to undergo Hep C or STI screening. Encouraged to call office for Tdap if he experiences a cut or burn.       Apneic episode    Encouraged patient to follow-up with pulmonology for sleep study as scheduled.        Return in about 6 months (around 10/01/2022) for F/U with Fortune Brannigan.   Ailene Ards, NP

## 2022-03-31 NOTE — Assessment & Plan Note (Signed)
Labs ordered today for further evaluation, Further recommendations may be made based on these results. Encouraged healthy diet of fruits, veggies, lean proteins and 150 mins of exercise per week.

## 2022-04-01 ENCOUNTER — Encounter: Payer: Self-pay | Admitting: Nurse Practitioner

## 2022-05-23 ENCOUNTER — Encounter (HOSPITAL_COMMUNITY): Payer: Self-pay

## 2022-05-23 ENCOUNTER — Ambulatory Visit (HOSPITAL_COMMUNITY)
Admission: EM | Admit: 2022-05-23 | Discharge: 2022-05-23 | Disposition: A | Discharge status: Home / Self Care | Payer: Medicaid Other

## 2022-05-23 DIAGNOSIS — H1131 Conjunctival hemorrhage, right eye: Secondary | ICD-10-CM

## 2022-05-23 NOTE — Discharge Instructions (Addendum)
Today you were evaluated for the redness to your eye which appears to be a busted blood vessel, information is in your packet about this condition  Typically this occurs from some form of strain, and triage blood pressure is 152/97 which while elevated is not emergent, monitor this closely at home and if levels continue to be around these numbers please follow-up with your primary doctor for reevaluation  Busted blood vessels typically heal with time you do not have to do any treatment  If you begin to have new symptoms such as pain, drainage, light sensitivity or trouble saying please follow-up for reevaluation

## 2022-05-23 NOTE — ED Triage Notes (Signed)
Here for R- eye redness that started yesterday. Denies any recent trauma to his eye.

## 2022-05-23 NOTE — ED Provider Notes (Signed)
[facility]    CSN: [id number] Arrival date & time: 05/23/22  1416      History   Chief Complaint Chief Complaint  Patient presents with   Eye Problem    HPI [name] is a 37 y.o. male.   Patient presents for evaluation of right eye redness that began upon awakening.  Has not attempted treatment.  Denies pain, pruritus, visual disturbance, light sensitivity or drainage.  Has not occurred before.  Denies increased stress, hypertension, injury or trauma, exposure to dust.      History reviewed. No pertinent past medical history.  Patient Active Problem List   Diagnosis Date Noted   Class 1 obesity without serious comorbidity with body mass index (BMI) of 34.0 to 34.9 in adult 03/31/2022   Encounter for general adult medical examination with abnormal findings 03/31/2022   Apneic episode 03/31/2022   Acute cough 11/02/2020    History reviewed. No pertinent surgical history.     Home Medications    Prior to Admission medications   Not on File    Family History Family History  Problem Relation Age of Onset   Diabetes Mother    Sleep apnea Father     Social History Social History   Tobacco Use   Smoking status: Every Day    Types: Cigars   Tobacco comments:    Pt reports smoking 1 black and mild a day. ALS 02/24/2022  Substance Use Topics   Alcohol use: Yes    Comment: social   Drug use: No     Allergies   Patient has no known allergies.   Review of Systems Review of Systems   Physical Exam Triage Vital Signs ED Triage Vitals [05/23/22 1542]  Enc Vitals Group     BP (!) 151/92     Pulse Rate 83     Resp 16     Temp 98.1 F (36.7 C)     Temp Source Oral     SpO2 96 %     Weight      Height      Head Circumference      Peak Flow      Pain Score      Pain Loc      Pain Edu?      Excl. in GC?    No data found.  Updated Vital Signs BP (!) 152/97 (BP Location: Left Arm)   Pulse 83   Temp 98.1 F (36.7 C) (Oral)    Resp 16   SpO2 96%   Visual Acuity Right Eye Distance:   Left Eye Distance:   Bilateral Distance:    Right Eye Near:   Left Eye Near:    Bilateral Near:     Physical Exam Constitutional:      Appearance: Normal appearance.  Eyes:     Comments: Erythema is present to the right conjunctiva and the cornea, no drainage noted on exam, vision is grossly intact, extraocular movements intact  Pulmonary:     Effort: Pulmonary effort is normal.  Neurological:     Mental Status: He is oriented to person, place, and time. Mental status is at baseline.      UC Treatments / Results  Labs (all labs ordered are listed, but only abnormal results are displayed) Labs Reviewed - No data to display  EKG   Radiology No results found.  Procedures Procedures (including critical care time)  Medications Ordered in UC Medications - No data to display  Initial Impression / Assessment and Plan / UC Course  I have reviewed the triage vital signs and the nursing notes.  Pertinent labs & imaging results that were available during my care of the patient were reviewed by me and considered in my medical decision making (see chart for details).  Subconjunctival hemorrhage of right eye  Presentation is consistent with above diagnoses, no current signs of infection, discussed timeline of possible healing, advised to monitor and follow-up if new symptoms occur Final Clinical Impressions(s) / UC Diagnoses   Final diagnoses:  None   Discharge Instructions   None    ED Prescriptions   None    PDMP not reviewed this encounter.   Valinda Hoar, [place] 05/23/22 [phone]

## 2022-06-24 ENCOUNTER — Encounter (HOSPITAL_BASED_OUTPATIENT_CLINIC_OR_DEPARTMENT_OTHER): Payer: Medicaid Other | Admitting: Pulmonary Disease

## 2022-07-22 ENCOUNTER — Encounter (HOSPITAL_BASED_OUTPATIENT_CLINIC_OR_DEPARTMENT_OTHER): Payer: Medicaid Other | Admitting: Pulmonary Disease

## 2022-08-17 ENCOUNTER — Ambulatory Visit (HOSPITAL_BASED_OUTPATIENT_CLINIC_OR_DEPARTMENT_OTHER): Payer: Medicaid Other | Attending: Pulmonary Disease | Admitting: Pulmonary Disease

## 2022-09-29 ENCOUNTER — Telehealth: Payer: Self-pay

## 2022-09-29 ENCOUNTER — Ambulatory Visit: Payer: Medicaid Other | Admitting: Nurse Practitioner

## 2022-09-29 VITALS — BP 102/70 | HR 60 | Temp 97.6°F | Ht 60.0 in | Wt 172.4 lb

## 2022-09-29 DIAGNOSIS — Z6834 Body mass index (BMI) 34.0-34.9, adult: Secondary | ICD-10-CM

## 2022-09-29 DIAGNOSIS — R7303 Prediabetes: Secondary | ICD-10-CM | POA: Diagnosis not present

## 2022-09-29 DIAGNOSIS — E669 Obesity, unspecified: Secondary | ICD-10-CM

## 2022-09-29 DIAGNOSIS — E785 Hyperlipidemia, unspecified: Secondary | ICD-10-CM | POA: Diagnosis not present

## 2022-09-29 DIAGNOSIS — R350 Frequency of micturition: Secondary | ICD-10-CM | POA: Diagnosis not present

## 2022-09-29 LAB — BASIC METABOLIC PANEL
BUN: 14 mg/dL (ref 6–23)
CO2: 28 meq/L (ref 19–32)
Calcium: 9.2 mg/dL (ref 8.4–10.5)
Chloride: 103 meq/L (ref 96–112)
Creatinine, Ser: 0.92 mg/dL (ref 0.40–1.50)
GFR: 106.52 mL/min (ref >60.00)
Glucose, Bld: 106 mg/dL — ABNORMAL HIGH (ref 70–99)
Potassium: 3.8 meq/L (ref 3.5–5.1)
Sodium: 140 meq/L (ref 135–145)

## 2022-09-29 LAB — URINALYSIS WITH CULTURE, IF INDICATED
Bilirubin Urine: NEGATIVE
Hgb urine dipstick: NEGATIVE
Ketones, ur: NEGATIVE
Leukocytes,Ua: NEGATIVE
Nitrite: NEGATIVE
RBC / HPF: NONE SEEN (ref 0–?)
Specific Gravity, Urine: 1.02 (ref 1.000–1.030)
Total Protein, Urine: NEGATIVE
Urine Glucose: NEGATIVE
Urobilinogen, UA: 0.2 (ref 0.0–1.0)
WBC, UA: NONE SEEN (ref 0–?)
pH: 7 (ref 5.0–8.0)

## 2022-09-29 LAB — HEMOGLOBIN A1C: Hgb A1c MFr Bld: 6 % (ref 4.6–6.5)

## 2022-09-29 NOTE — Assessment & Plan Note (Signed)
Labs ordered, further recommendations may be made based upon these results. 

## 2022-09-29 NOTE — Assessment & Plan Note (Signed)
Chronic Discussed lifestyle modification including diet and exercise recommendations.  Handout provided on Mediterranean diet. Consider rechecking in 3 to 6 months.

## 2022-09-29 NOTE — Assessment & Plan Note (Signed)
Chronic, A1c 6.3 Lifestyle modification discussed today including importance of diet and exercise to help delay progression of type 2 diabetes. Patient did mention some urinary frequency since last time I saw him.  No dysuria or hematuria.  Check urinalysis and urine culture.  Also check A1c.  If A1c in prediabetic range per shared decision making we will start patient on metformin 500 mg daily in addition to lifestyle modification recommendations.  If A1c is in diabetic range we will schedule a virtual visit to discuss other treatment options other than or in addition to metformin.

## 2022-09-29 NOTE — Progress Notes (Signed)
Established Patient Office Visit  Subjective   Patient ID: Daryl Avery, male    DOB: 1985-07-10  Age: 37 y.o. MRN: [id number]  Chief Complaint  Patient presents with   Medical Management of Chronic Issues    6 month follow up    Prediabetes/HLD/Obesity: Last A1c 6.3, last LDL 122.  Current BMI 33.66.  Patient here for follow-up to discuss.    Review of Systems  Respiratory:  Negative for cough and shortness of breath.   Cardiovascular:  Negative for chest pain and palpitations.  Genitourinary:  Positive for frequency. Negative for dysuria and hematuria.      Objective:     BP 102/70   Pulse 60   Temp 97.6 F (36.4 C) (Temporal)   Ht 5' (1.524 m)   Wt 172 lb 6 oz (78.2 kg)   SpO2 98%   BMI 33.66 kg/m  BP Readings from Last 3 Encounters:  09/29/22 102/70  05/23/22 (!) 152/97  03/31/22 128/86   Wt Readings from Last 3 Encounters:  09/29/22 172 lb 6 oz (78.2 kg)  03/31/22 178 lb 4 oz (80.9 kg)  02/24/22 183 lb 6.4 oz (83.2 kg)      Physical Exam Vitals reviewed.  Constitutional:      Appearance: Normal appearance.  HENT:     Head: Normocephalic and atraumatic.  Cardiovascular:     Rate and Rhythm: Normal rate and regular rhythm.  Pulmonary:     Effort: Pulmonary effort is normal.     Breath sounds: Normal breath sounds.  Musculoskeletal:     Cervical back: Neck supple.  Skin:    General: Skin is warm and dry.  Neurological:     Mental Status: He is alert and oriented to person, place, and time.  Psychiatric:        Mood and Affect: Mood normal.        Behavior: Behavior normal.        Thought Content: Thought content normal.        Judgment: Judgment normal.      No results found for any visits on 09/29/22.    The ASCVD Risk score (Arnett DK, et al., 2019) failed to calculate for the following reasons:   The 2019 ASCVD risk score is only valid for ages 28 to 45    Assessment & Plan:   Problem List Items Addressed This Visit        Other   Class 1 obesity without serious comorbidity with body mass index (BMI) of 34.0 to 34.9 in adult    Labs ordered, further recommendations may be made based upon these results       Prediabetes - Primary    Chronic, A1c 6.3 Lifestyle modification discussed today including importance of diet and exercise to help delay progression of type 2 diabetes. Patient did mention some urinary frequency since last time I saw him.  No dysuria or hematuria.  Check urinalysis and urine culture.  Also check A1c.  If A1c in prediabetic range per shared decision making we will start patient on metformin 500 mg daily in addition to lifestyle modification recommendations.  If A1c is in diabetic range we will schedule a virtual visit to discuss other treatment options other than or in addition to metformin.      Relevant Orders   Hemoglobin A1c   Basic metabolic panel   Urinalysis with Culture, if indicated   Urine Culture   Frequency of micturition    Labs ordered, see  plan under prediabetes for more detail.  Further recommendations may be made based upon these results      Relevant Orders   Urinalysis with Culture, if indicated   Urine Culture   HLD (hyperlipidemia)    Chronic Discussed lifestyle modification including diet and exercise recommendations.  Handout provided on Mediterranean diet. Consider rechecking in 3 to 6 months.       Return in about 2 months (around 11/29/2022) for F/U with Angla Delahunt.    Elenore Paddy, NP

## 2022-09-29 NOTE — Telephone Encounter (Signed)
Pts mom has called and stated pt can not take Metformin as instructed due to family history of reaction including gma passing away from reaction this medication and pts mom has hx of reaction.  **Pts mom asked if PCP can please send in another medication similar to Metformin that does not have same ingredients.

## 2022-09-29 NOTE — Patient Instructions (Signed)
Pain with urination or blood in urine call office

## 2022-09-29 NOTE — Assessment & Plan Note (Signed)
Labs ordered, see plan under prediabetes for more detail.  Further recommendations may be made based upon these results

## 2022-09-30 LAB — URINE CULTURE: Result:: NO GROWTH

## 2023-03-16 ENCOUNTER — Encounter: Payer: Self-pay | Admitting: Nurse Practitioner

## 2023-03-16 ENCOUNTER — Ambulatory Visit: Payer: Medicaid Other | Admitting: Nurse Practitioner

## 2023-03-16 VITALS — BP 120/80 | HR 80 | Temp 98.0°F | Ht 60.0 in | Wt 175.0 lb

## 2023-03-16 DIAGNOSIS — E785 Hyperlipidemia, unspecified: Secondary | ICD-10-CM

## 2023-03-16 DIAGNOSIS — R7303 Prediabetes: Secondary | ICD-10-CM

## 2023-03-16 DIAGNOSIS — Z6834 Body mass index (BMI) 34.0-34.9, adult: Secondary | ICD-10-CM

## 2023-03-16 DIAGNOSIS — E66811 Obesity, class 1: Secondary | ICD-10-CM

## 2023-03-16 LAB — COMPREHENSIVE METABOLIC PANEL
ALT: 40 U/L (ref 0–53)
AST: 23 U/L (ref 0–37)
Albumin: 4.4 g/dL (ref 3.5–5.2)
Alkaline Phosphatase: 73 U/L (ref 39–117)
BUN: 13 mg/dL (ref 6–23)
CO2: 29 meq/L (ref 19–32)
Calcium: 9.1 mg/dL (ref 8.4–10.5)
Chloride: 103 meq/L (ref 96–112)
Creatinine, Ser: 0.95 mg/dL (ref 0.40–1.50)
GFR: 102.17 mL/min (ref >60.00)
Glucose, Bld: 92 mg/dL (ref 70–99)
Potassium: 3.8 meq/L (ref 3.5–5.1)
Sodium: 140 meq/L (ref 135–145)
Total Bilirubin: 0.6 mg/dL (ref 0.2–1.2)
Total Protein: 7.4 g/dL (ref 6.0–8.3)

## 2023-03-16 LAB — LIPID PANEL
Cholesterol: 174 mg/dL (ref 0–200)
HDL: 37.3 mg/dL — ABNORMAL LOW (ref >39.00)
LDL Cholesterol: 101 mg/dL — ABNORMAL HIGH (ref 0–99)
NonHDL: 136.41
Total CHOL/HDL Ratio: 5
Triglycerides: 175 mg/dL — ABNORMAL HIGH (ref 0.0–149.0)
VLDL: 35 mg/dL (ref 0.0–40.0)

## 2023-03-16 LAB — CBC
HCT: 44.8 % (ref 39.0–52.0)
Hemoglobin: 15 g/dL (ref 13.0–17.0)
MCHC: 33.5 g/dL (ref 30.0–36.0)
MCV: 90.2 fl (ref 78.0–100.0)
Platelets: 270 10*3/uL (ref 150.0–400.0)
RBC: 4.97 Mil/uL (ref 4.22–5.81)
RDW: 13.1 % (ref 11.5–15.5)
WBC: 8.1 10*3/uL (ref 4.0–10.5)

## 2023-03-16 LAB — TSH: TSH: 0.69 u[IU]/mL (ref 0.35–5.50)

## 2023-03-16 LAB — HEMOGLOBIN A1C: Hgb A1c MFr Bld: 6.1 % (ref 4.6–6.5)

## 2023-03-16 NOTE — Assessment & Plan Note (Signed)
 Chronic Labs ordered, further recommendations may be made based upon his results.

## 2023-03-16 NOTE — Assessment & Plan Note (Signed)
 Chronic Labs ordered, further recommendations may be made based upon these results

## 2023-03-16 NOTE — Progress Notes (Signed)
 Established Patient Office Visit  Subjective   Patient ID: Daryl Avery, male    DOB: Aug 29, 1985  Age: 38 y.o. MRN: [id number]  Chief Complaint  Patient presents with   Prediabetes    Patient has today for follow-up.  He would like to have labs checked. He has history of prediabetes, last A1c was 6.0.  Also has history of hyperlipidemia with last LDL 122.  He has no other acute concerns.    Review of Systems  Constitutional:  Negative for weight loss.  Respiratory:  Negative for shortness of breath.   Cardiovascular:  Negative for chest pain.      Objective:     BP 120/80 (BP Location: Left Arm, Patient Position: Sitting, Cuff Size: Normal)   Pulse 80   Temp 98 F (36.7 C) (Oral)   Ht 5' (1.524 m)   Wt 175 lb (79.4 kg)   SpO2 99%   BMI 34.18 kg/m  BP Readings from Last 3 Encounters:  03/16/23 120/80  09/29/22 102/70  05/23/22 (!) 152/97   Wt Readings from Last 3 Encounters:  03/16/23 175 lb (79.4 kg)  09/29/22 172 lb 6 oz (78.2 kg)  03/31/22 178 lb 4 oz (80.9 kg)      Physical Exam Vitals reviewed.  Constitutional:      Appearance: Normal appearance.  HENT:     Head: Normocephalic and atraumatic.  Cardiovascular:     Rate and Rhythm: Normal rate and regular rhythm.  Pulmonary:     Effort: Pulmonary effort is normal.     Breath sounds: Normal breath sounds.  Musculoskeletal:     Cervical back: Neck supple.  Skin:    General: Skin is warm and dry.  Neurological:     Mental Status: He is alert and oriented to person, place, and time.  Psychiatric:        Mood and Affect: Mood normal.        Behavior: Behavior normal.        Thought Content: Thought content normal.        Judgment: Judgment normal.      No results found for any visits on 03/16/23.    The ASCVD Risk score (Arnett DK, et al., 2019) failed to calculate for the following reasons:   The 2019 ASCVD risk score is only valid for ages 3 to 61    Assessment & Plan:   Problem  List Items Addressed This Visit       Other   Class 1 obesity without serious comorbidity with body mass index (BMI) of 34.0 to 34.9 in adult - Primary   Chronic Labs ordered, further recommendations may be made based upon his results       Relevant Orders   Hemoglobin A1c   Comprehensive metabolic panel   Lipid panel   CBC   TSH   Prediabetes   Chronic Labs ordered, further recommendations may be made based upon his results       Relevant Orders   Hemoglobin A1c   Comprehensive metabolic panel   Lipid panel   CBC   TSH   HLD (hyperlipidemia)   Chronic Labs ordered, further recommendations may be made based upon these results       Relevant Orders   Hemoglobin A1c   Comprehensive metabolic panel   Lipid panel   CBC   TSH    Return in about 7 months (around 10/14/2023) for CPE with Maralyn Sago.    Elenore Paddy, NP

## 2023-11-10 ENCOUNTER — Ambulatory Visit: Admitting: Nurse Practitioner

## 2023-11-10 VITALS — BP 138/86 | HR 78 | Temp 98.3°F | Ht 60.0 in | Wt 177.0 lb

## 2023-11-10 DIAGNOSIS — E66811 Obesity, class 1: Secondary | ICD-10-CM | POA: Diagnosis not present

## 2023-11-10 DIAGNOSIS — L259 Unspecified contact dermatitis, unspecified cause: Secondary | ICD-10-CM | POA: Diagnosis not present

## 2023-11-10 DIAGNOSIS — R7303 Prediabetes: Secondary | ICD-10-CM | POA: Diagnosis not present

## 2023-11-10 DIAGNOSIS — Z6834 Body mass index (BMI) 34.0-34.9, adult: Secondary | ICD-10-CM

## 2023-11-10 DIAGNOSIS — E785 Hyperlipidemia, unspecified: Secondary | ICD-10-CM | POA: Diagnosis not present

## 2023-11-10 DIAGNOSIS — Z0000 Encounter for general adult medical examination without abnormal findings: Secondary | ICD-10-CM | POA: Diagnosis not present

## 2023-11-10 DIAGNOSIS — Z0001 Encounter for general adult medical examination with abnormal findings: Secondary | ICD-10-CM

## 2023-11-10 MED ORDER — TRIAMCINOLONE ACETONIDE 0.1 % EX CREA: 1.0000 | TOPICAL_CREAM | Freq: Two times a day (BID) | CUTANEOUS | 1 refills | Status: AC

## 2023-11-10 NOTE — Assessment & Plan Note (Signed)
 Hyperlipidemia - Order fasting lipid panel within the next few weeks.

## 2023-11-10 NOTE — Assessment & Plan Note (Signed)
 Contact dermatitis of the back Contact dermatitis with itching for one week.  No recent detergent changes, infection, or bug bites. - Prescribe triamcinolone cream twice daily for two weeks. - Advise monitoring for improvement and contact if no improvement within two weeks for potential dermatology referral.

## 2023-11-10 NOTE — Assessment & Plan Note (Signed)
 Health maintenance Routine health maintenance discussed. Hepatitis C screening recommended. Declined STD screening. - Order hepatitis C screening.  Flu shot Declined flu shot for this season

## 2023-11-10 NOTE — Progress Notes (Signed)
 Complete physical exam  Patient: Daryl Avery   DOB: 1985/09/25   38 y.o. Male  MRN: [id number]  Subjective:    Chief Complaint  Patient presents with   Annual Exam    Daryl Avery is a 38 y.o. male who presents today for a complete physical exam.   Discussed the use of AI scribe software for clinical note transcription with the patient, who gave verbal consent to proceed.  History of Present Illness Daryl Avery is a 38 year old male who presents for an annual physical exam and evaluation of a rash on his back.  Pruritic rash - Intensely pruritic rash developed on the back one week ago - Distributed diffusely across the back - No associated pain - No recent changes in laundry detergent, soaps, or other potential irritants - No history of eczema or similar dermatologic conditions  Metabolic history - History of prediabetes - History of HLD, last LDL 101   Most recent fall risk assessment:    11/10/2023    2:43 PM  Fall Risk   Falls in the past year? 0  Number falls in past yr: 0  Injury with Fall? 0  Risk for fall due to : No Fall Risks  Follow up Falls evaluation completed     Most recent depression screenings:    11/10/2023    2:43 PM 03/31/2022   10:19 AM  PHQ 2/9 Scores  PHQ - 2 Score 0 3  PHQ- 9 Score  11      No past medical history on file. No past surgical history on file. Social History   Tobacco Use   Smoking status: Every Day    Types: Cigars   Tobacco comments:    Pt reports smoking 1 black and mild a day. ALS 02/24/2022  Substance Use Topics   Alcohol use: Yes    Comment: social   Drug use: No   Family History  Problem Relation Age of Onset   Diabetes Mother    Sleep apnea Father    Allergies  Allergen Reactions   Metformin And Related       Patient Care Team: Elnor Lauraine BRAVO, NP as PCP - General (Nurse Practitioner)   No outpatient medications prior to visit.   No facility-administered medications prior to  visit.    Review of Systems  Constitutional:  Negative for chills, fever and weight loss.  Respiratory:  Negative for cough, shortness of breath and wheezing.   Cardiovascular:  Negative for chest pain and palpitations.  Gastrointestinal:  Negative for abdominal pain and blood in stool.  Genitourinary:  Negative for dysuria and flank pain.  Psychiatric/Behavioral:  Negative for depression and suicidal ideas. The patient is not nervous/anxious.           Objective:     BP 138/86   Pulse 78   Temp 98.3 F (36.8 C) (Temporal)   Ht 5' (1.524 m)   Wt 177 lb (80.3 kg)   SpO2 99%   BMI 34.57 kg/m  BP Readings from Last 3 Encounters:  11/10/23 138/86  03/16/23 120/80  09/29/22 102/70   Wt Readings from Last 3 Encounters:  11/10/23 177 lb (80.3 kg)  03/16/23 175 lb (79.4 kg)  09/29/22 172 lb 6 oz (78.2 kg)      Physical Exam Vitals reviewed.  Constitutional:      General: He is not in acute distress.    Appearance: Normal appearance. He is not ill-appearing.  HENT:  Head: Normocephalic and atraumatic.     Right Ear: Tympanic membrane, ear canal and external ear normal.     Left Ear: Tympanic membrane, ear canal and external ear normal.  Eyes:     General: No scleral icterus.    Extraocular Movements: Extraocular movements intact.     Conjunctiva/sclera: Conjunctivae normal.     Pupils: Pupils are equal, round, and reactive to light.  Neck:     Vascular: No carotid bruit.  Cardiovascular:     Rate and Rhythm: Normal rate and regular rhythm.     Pulses: Normal pulses.     Heart sounds: Normal heart sounds.  Pulmonary:     Effort: Pulmonary effort is normal.     Breath sounds: Normal breath sounds.  Abdominal:     General: Bowel sounds are normal. There is no distension.     Palpations: There is no mass.     Tenderness: There is no abdominal tenderness.     Hernia: No hernia is present.  Musculoskeletal:        General: No swelling or tenderness.      Cervical back: Normal range of motion and neck supple. No rigidity.  Lymphadenopathy:     Cervical: No cervical adenopathy.  Skin:    General: Skin is warm and dry.         Comments: Area in red indicates location of rash. Small papules spread diffusely across back. No weeping   Neurological:     General: No focal deficit present.     Mental Status: He is alert and oriented to person, place, and time.     Cranial Nerves: No cranial nerve deficit.     Sensory: No sensory deficit.     Motor: No weakness.     Gait: Gait normal.  Psychiatric:        Mood and Affect: Mood normal.        Behavior: Behavior normal.        Judgment: Judgment normal.          11/10/2023    2:43 PM 03/31/2022   10:19 AM 03/31/2022   10:18 AM  PHQ9 SCORE ONLY  PHQ-9 Total Score 0 11  11      Data saved with a previous flowsheet row definition      No results found for any visits on 11/10/23.     Assessment & Plan:    Routine Health Maintenance and Physical Exam   There is no immunization history on file for this patient.  Health Maintenance  Topic Date Due   HIV Screening  Never done   Hepatitis C Screening  Never done   Hepatitis B Vaccines 19-59 Average Risk (1 of 3 - 19+ 3-dose series) Never done   HPV VACCINES (1 - 3-dose SCDM series) Never done   COVID-19 Vaccine (1 - 2025-26 season) Never done   Influenza Vaccine  04/16/2024 (Originally 08/18/2023)   Pneumococcal Vaccine (1 of 2 - PCV) 11/09/2024 (Originally 07/29/2004)   Meningococcal B Vaccine  Aged Out   DTaP/Tdap/Td  Discontinued    Discussed health benefits of physical activity, and encouraged him to engage in regular exercise appropriate for his age and condition.  Problem List Items Addressed This Visit       Musculoskeletal and Integument   Contact dermatitis   Contact dermatitis of the back Contact dermatitis with itching for one week.  No recent detergent changes, infection, or bug bites. - Prescribe triamcinolone  cream twice daily for  two weeks. - Advise monitoring for improvement and contact if no improvement within two weeks for potential dermatology referral.      Relevant Medications   triamcinolone cream (KENALOG) 0.1 %     Other   Class 1 obesity without serious comorbidity with body mass index (BMI) of 34.0 to 34.9 in adult - Primary   Relevant Orders   CBC   Comprehensive metabolic panel with GFR   Hemoglobin A1c   Lipid panel   TSH   Hepatitis C Antibody   Encounter for general adult medical examination with abnormal findings   Health maintenance Routine health maintenance discussed. Hepatitis C screening recommended. Declined STD screening. - Order hepatitis C screening.  Flu shot Declined flu shot for this season      Relevant Orders   CBC   Comprehensive metabolic panel with GFR   Hemoglobin A1c   Lipid panel   TSH   Hepatitis C Antibody   Prediabetes   Prediabetes Prediabetes. - Order fasting labs including A1c within the next few weeks.      Relevant Orders   CBC   Comprehensive metabolic panel with GFR   Hemoglobin A1c   Lipid panel   TSH   Hepatitis C Antibody   HLD (hyperlipidemia)   Hyperlipidemia - Order fasting lipid panel within the next few weeks.      Relevant Orders   CBC   Comprehensive metabolic panel with GFR   Hemoglobin A1c   Lipid panel   TSH   Hepatitis C Antibody  Assessment and Plan Assessment & Plan Contact dermatitis of the back Contact dermatitis with itching for one week.  No recent detergent changes, infection, or bug bites. - Prescribe triamcinolone cream twice daily for two weeks. - Advise monitoring for improvement and contact if no improvement within two weeks for potential dermatology referral.  Prediabetes Prediabetes. - Order fasting labs including A1c within the next few weeks.  Hyperlipidemia - Order fasting lipid panel within the next few weeks.  Health maintenance Routine health maintenance discussed.  Hepatitis C screening recommended. Declined STD screening. - Order hepatitis C screening.  Flu shot Declined flu shot for this season.  In addition to annual physical exam I also completed and office visit as detailed above.  Return in about 1 year (around 11/09/2024) for CPE with Kale Dols.     Lauraine FORBES Pereyra, NP

## 2023-11-10 NOTE — Assessment & Plan Note (Signed)
 Prediabetes Prediabetes. - Order fasting labs including A1c within the next few weeks.

## 2023-11-27 ENCOUNTER — Encounter (HOSPITAL_COMMUNITY): Payer: Self-pay | Admitting: *Deleted

## 2023-11-27 ENCOUNTER — Ambulatory Visit (HOSPITAL_COMMUNITY)
Admission: EM | Admit: 2023-11-27 | Discharge: 2023-11-27 | Disposition: A | Discharge status: Home / Self Care | Attending: Physician Assistant | Admitting: Physician Assistant

## 2023-11-27 DIAGNOSIS — J101 Influenza due to other identified influenza virus with other respiratory manifestations: Secondary | ICD-10-CM

## 2023-11-27 DIAGNOSIS — R051 Acute cough: Secondary | ICD-10-CM

## 2023-11-27 LAB — POC COVID19/FLU A&B COMBO
Covid Antigen, POC: NEGATIVE
Influenza A Antigen, POC: POSITIVE — AB
Influenza B Antigen, POC: NEGATIVE

## 2023-11-27 MED ORDER — ACETAMINOPHEN 325 MG PO TABS
650.0000 mg | ORAL_TABLET | Freq: Once | ORAL | Status: AC
  Administered 2023-11-27: 650 mg via ORAL

## 2023-11-27 MED ORDER — ACETAMINOPHEN 325 MG PO TABS
ORAL_TABLET | ORAL | Status: AC
  Filled 2023-11-27: qty 2

## 2023-11-27 NOTE — Discharge Instructions (Signed)
 ISIT SUMMARY:  You came in today with flu-like symptoms that started last night, including fever, chills, fatigue, cough, diarrhea, and headaches. After evaluating your symptoms and running a test, we confirmed that you have an Influenza A infection.  YOUR PLAN:  -INFLUENZA A INFECTION: Influenza A is a viral infection that affects your respiratory system. Your symptoms include fever, chills, fatigue, cough, and headache. Since your symptoms started within the last 48 hours, you had the option for antiviral treatment, but you chose over-the-counter medications instead. I recommend using DayQuil, Nyquil, Theraflu, or Alka-Seltzer to help relieve your symptoms. Make sure to stay well hydrated, get plenty of rest, wear a mask, and practice good hand hygiene. I have provided a work note for you to take a day or two off to recover. Please monitor for severe symptoms like difficulty breathing, persistent fever, abdominal pain, dehydration, neck stiffness, confusion, or fainting, and seek emergency care if these occur.  -ACUTE DIARRHEA: Diarrhea is a common symptom of Influenza A. It's important to monitor for signs of dehydration and keep yourself well hydrated.  -ACUTE HEADACHE: Headaches can occur with Influenza A. Over-the-counter medications can help relieve your headache.  INSTRUCTIONS:  Please follow up if your symptoms worsen or if you experience any severe symptoms as mentioned. Make sure to rest and take care of yourself over the next few days.

## 2023-11-27 NOTE — ED Triage Notes (Signed)
 Pt states he has the shakes and feels cold that started today. He has not taken any meds. He did start coughing middle of the night last night

## 2023-11-27 NOTE — ED Provider Notes (Signed)
 [facility]    CSN: [id number] Arrival date & time: 11/27/23  9080      History   Chief Complaint Chief Complaint  Patient presents with   Shaking   Cough    HPI Daryl Avery is a 38 y.o. male.  has no past medical history on file.   HPI  Discussed the use of AI scribe software for clinical note transcription with the patient, who gave verbal consent to proceed.   The patient presents with flu-like symptoms.  Symptoms began last night and include fever, chills, fatigue, cough, and diarrhea. He also has headaches. There is no nasal congestion, runny nose, sore throat, ear pain, or drainage down the back of the throat. No nausea, vomiting, or abdominal pain is present, and he does not report any body aches. He does report headaches.  He has not taken any medication for his symptoms. No shortness of breath or wheezing is present. No one around him is experiencing similar symptoms.   History reviewed. No pertinent past medical history.  Patient Active Problem List   Diagnosis Date Noted   Contact dermatitis 11/10/2023   Prediabetes 09/29/2022   Frequency of micturition 09/29/2022   HLD (hyperlipidemia) 09/29/2022   Class 1 obesity without serious comorbidity with body mass index (BMI) of 34.0 to 34.9 in adult 03/31/2022   Encounter for general adult medical examination with abnormal findings 03/31/2022   Apneic episode 03/31/2022    History reviewed. No pertinent surgical history.     Home Medications    Prior to Admission medications   Medication Sig Start Date End Date Taking? Authorizing Provider  triamcinolone cream (KENALOG) 0.1 % Apply 1 Application topically 2 (two) times daily. 11/10/23   Elnor Lauraine BRAVO, NP    Family History Family History  Problem Relation Age of Onset   Diabetes Mother    Sleep apnea Father     Social History Social History   Tobacco Use   Smoking status: Every Day    Types: Cigars   Tobacco comments:    Pt  reports smoking 1 black and mild a day. ALS 02/24/2022  Vaping Use   Vaping status: Never Used  Substance Use Topics   Alcohol use: Yes    Comment: social   Drug use: No     Allergies   Metformin and related   Review of Systems Review of Systems  Constitutional:  Positive for chills, fatigue and fever.  HENT:  Negative for congestion, ear pain, postnasal drip, rhinorrhea and sore throat.   Respiratory:  Positive for cough. Negative for shortness of breath and wheezing.   Gastrointestinal:  Positive for diarrhea. Negative for abdominal pain, nausea and vomiting.  Musculoskeletal:  Negative for myalgias.  Neurological:  Positive for headaches. Negative for dizziness and light-headedness.     Physical Exam Triage Vital Signs ED Triage Vitals  Encounter Vitals Group     BP 11/27/23 1041 130/86     Girls Systolic BP Percentile --      Girls Diastolic BP Percentile --      Boys Systolic BP Percentile --      Boys Diastolic BP Percentile --      Pulse Rate 11/27/23 1041 (!) 106     Resp 11/27/23 1041 16     Temp 11/27/23 1041 (!) 102 F (38.9 C)     Temp Source 11/27/23 1041 Oral     SpO2 11/27/23 1041 95 %     Weight --  Height --      Head Circumference --      Peak Flow --      Pain Score 11/27/23 1040 0     Pain Loc --      Pain Education --      Exclude from Growth Chart --    No data found.  Updated Vital Signs BP 130/86 (BP Location: Right Arm)   Pulse 93   Temp (!) 100.9 F (38.3 C) (Oral)   Resp 16   SpO2 96%   Visual Acuity Right Eye Distance:   Left Eye Distance:   Bilateral Distance:    Right Eye Near:   Left Eye Near:    Bilateral Near:     Physical Exam Vitals reviewed.  Constitutional:      General: He is awake.     Appearance: Normal appearance. He is well-developed and well-groomed.  HENT:     Head: Normocephalic and atraumatic.     Right Ear: Hearing, tympanic membrane and ear canal normal.     Left Ear: Hearing, tympanic  membrane and ear canal normal.     Mouth/Throat:     Lips: Pink.     Mouth: Mucous membranes are moist.     Pharynx: Oropharynx is clear. Uvula midline. No pharyngeal swelling, oropharyngeal exudate, posterior oropharyngeal erythema, uvula swelling or postnasal drip.     Tonsils: No tonsillar exudate or tonsillar abscesses.  Eyes:     General: Lids are normal. Gaze aligned appropriately.     Extraocular Movements: Extraocular movements intact.  Cardiovascular:     Rate and Rhythm: Normal rate and regular rhythm.     Heart sounds: Normal heart sounds.  Pulmonary:     Effort: Pulmonary effort is normal.     Breath sounds: Normal breath sounds. No decreased air movement. No decreased breath sounds, wheezing, rhonchi or rales.  Musculoskeletal:     Cervical back: Normal range of motion and neck supple.  Lymphadenopathy:     Head:     Right side of head: No submental, submandibular or preauricular adenopathy.     Left side of head: No submental, submandibular or preauricular adenopathy.     Cervical:     Right cervical: No superficial cervical adenopathy.    Left cervical: No superficial cervical adenopathy.     Upper Body:     Right upper body: No supraclavicular adenopathy.     Left upper body: No supraclavicular adenopathy.  Skin:    General: Skin is warm and dry.  Neurological:     General: No focal deficit present.     Mental Status: He is alert and oriented to person, place, and time.  Psychiatric:        Mood and Affect: Mood normal.        Behavior: Behavior normal. Behavior is cooperative.        Thought Content: Thought content normal.        Judgment: Judgment normal.      UC Treatments / Results  Labs (all labs ordered are listed, but only abnormal results are displayed) Labs Reviewed  POC COVID19/FLU A&B COMBO - Abnormal; Notable for the following components:      Result Value   Influenza A Antigen, POC Positive (*)    All other components within normal limits     EKG   Radiology No results found.  Procedures Procedures (including critical care time)  Medications Ordered in UC Medications  acetaminophen  (TYLENOL ) tablet 650 mg (650 mg  Oral Given 11/27/23 1044)    Initial Impression / Assessment and Plan / UC Course  I have reviewed the triage vital signs and the nursing notes.  Pertinent labs & imaging results that were available during my care of the patient were reviewed by me and considered in my medical decision making (see chart for details).      Final Clinical Impressions(s) / UC Diagnoses   Final diagnoses:  Influenza A  Acute cough   Influenza A infection Acute Influenza A infection confirmed by positive test. Symptoms include fever, chills, fatigue, cough, and headache. Symptoms began last night, within the 48-hour window for antiviral treatment. He opted for over-the-counter medications instead of Tamiflu due to potential side effects such as nausea, vomiting, and vivid dreams. Fever of 102 in clinic has improved to 100.9 after tylenol  administration. Recommend antipyretic use per manufacturers instructions for adequate management.  - Recommended over-the-counter medications such as DayQuil, Nyquil, Theraflu, or Alka-Seltzer for symptom relief. - Advised staying well hydrated, resting, masking up, and practicing good hand hygiene. - Provided a work note for a day or two off to recover. - Instructed to monitor for severe symptoms such as difficulty breathing, persistent fever, abdominal pain, dehydration, neck stiffness, confusion, or syncope, and seek emergency care if these occur.  Acute diarrhea Reported as part of the symptomatology of the Influenza A infection. - Advised monitoring for signs of dehydration and maintaining hydration.  Acute headache Headache reported as part of the symptomatology of the Influenza A infection. - Recommended over-the-counter medications for headache relief.    Discharge  Instructions      ISIT SUMMARY:  You came in today with flu-like symptoms that started last night, including fever, chills, fatigue, cough, diarrhea, and headaches. After evaluating your symptoms and running a test, we confirmed that you have an Influenza A infection.  YOUR PLAN:  -INFLUENZA A INFECTION: Influenza A is a viral infection that affects your respiratory system. Your symptoms include fever, chills, fatigue, cough, and headache. Since your symptoms started within the last 48 hours, you had the option for antiviral treatment, but you chose over-the-counter medications instead. I recommend using DayQuil, Nyquil, Theraflu, or Alka-Seltzer to help relieve your symptoms. Make sure to stay well hydrated, get plenty of rest, wear a mask, and practice good hand hygiene. I have provided a work note for you to take a day or two off to recover. Please monitor for severe symptoms like difficulty breathing, persistent fever, abdominal pain, dehydration, neck stiffness, confusion, or fainting, and seek emergency care if these occur.  -ACUTE DIARRHEA: Diarrhea is a common symptom of Influenza A. It's important to monitor for signs of dehydration and keep yourself well hydrated.  -ACUTE HEADACHE: Headaches can occur with Influenza A. Over-the-counter medications can help relieve your headache.  INSTRUCTIONS:  Please follow up if your symptoms worsen or if you experience any severe symptoms as mentioned. Make sure to rest and take care of yourself over the next few days.     ED Prescriptions   None    PDMP not reviewed this encounter.   Marylene Rocky BRAVO, PA-C 11/27/23 1121
